# Patient Record
Sex: Female | Born: 1960 | Race: White | Hispanic: No | Marital: Married | Smoking: Never smoker
Health system: Southern US, Community
[De-identification: ages and names within clinical notes are randomized; demographics above are authoritative.]

## PROBLEM LIST (undated history)

## (undated) DIAGNOSIS — K219 Gastro-esophageal reflux disease without esophagitis: Secondary | ICD-10-CM

## (undated) DIAGNOSIS — R002 Palpitations: Secondary | ICD-10-CM

## (undated) DIAGNOSIS — J45909 Unspecified asthma, uncomplicated: Secondary | ICD-10-CM

## (undated) DIAGNOSIS — F411 Generalized anxiety disorder: Secondary | ICD-10-CM

## (undated) DIAGNOSIS — F329 Major depressive disorder, single episode, unspecified: Secondary | ICD-10-CM

## (undated) DIAGNOSIS — R0602 Shortness of breath: Secondary | ICD-10-CM

## (undated) DIAGNOSIS — I81 Portal vein thrombosis: Secondary | ICD-10-CM

## (undated) DIAGNOSIS — E119 Type 2 diabetes mellitus without complications: Secondary | ICD-10-CM

## (undated) DIAGNOSIS — F32A Depression, unspecified: Secondary | ICD-10-CM

## (undated) DIAGNOSIS — J189 Pneumonia, unspecified organism: Secondary | ICD-10-CM

## (undated) DIAGNOSIS — K529 Noninfective gastroenteritis and colitis, unspecified: Secondary | ICD-10-CM

## (undated) HISTORY — PX: BREAST EXCISIONAL BIOPSY: SUR124

## (undated) HISTORY — DX: Palpitations: R00.2

## (undated) HISTORY — DX: Major depressive disorder, single episode, unspecified: F32.9

## (undated) HISTORY — PX: ABDOMINAL HYSTERECTOMY: SHX81

## (undated) HISTORY — DX: Unspecified asthma, uncomplicated: J45.909

## (undated) HISTORY — PX: SHOULDER ARTHROSCOPY: SHX128

## (undated) HISTORY — DX: Shortness of breath: R06.02

## (undated) HISTORY — DX: Depression, unspecified: F32.A

## (undated) HISTORY — DX: Generalized anxiety disorder: F41.1

## (undated) HISTORY — DX: Noninfective gastroenteritis and colitis, unspecified: K52.9

## (undated) HISTORY — DX: Gastro-esophageal reflux disease without esophagitis: K21.9

## (undated) HISTORY — DX: Portal vein thrombosis: I81

---

## 1998-01-24 ENCOUNTER — Other Ambulatory Visit: Admission: RE | Admit: 1998-01-24 | Discharge: 1998-01-24 | Payer: Self-pay | Admitting: Obstetrics and Gynecology

## 1998-05-29 ENCOUNTER — Inpatient Hospital Stay (HOSPITAL_COMMUNITY): Admission: AD | Admit: 1998-05-29 | Discharge: 1998-06-01 | Payer: Self-pay | Admitting: Obstetrics and Gynecology

## 1999-04-06 ENCOUNTER — Ambulatory Visit (HOSPITAL_COMMUNITY): Admission: RE | Admit: 1999-04-06 | Discharge: 1999-04-06 | Payer: Self-pay | Admitting: Orthopedic Surgery

## 1999-04-06 ENCOUNTER — Encounter: Payer: Self-pay | Admitting: Orthopedic Surgery

## 1999-04-10 ENCOUNTER — Encounter: Payer: Self-pay | Admitting: Neurosurgery

## 1999-04-10 ENCOUNTER — Ambulatory Visit (HOSPITAL_COMMUNITY): Admission: RE | Admit: 1999-04-10 | Discharge: 1999-04-11 | Payer: Self-pay | Admitting: Neurosurgery

## 1999-05-30 ENCOUNTER — Encounter: Payer: Self-pay | Admitting: Neurosurgery

## 1999-05-30 ENCOUNTER — Ambulatory Visit (HOSPITAL_COMMUNITY): Admission: RE | Admit: 1999-05-30 | Discharge: 1999-05-30 | Payer: Self-pay | Admitting: Neurosurgery

## 1999-06-01 ENCOUNTER — Encounter: Payer: Self-pay | Admitting: Neurosurgery

## 1999-06-01 ENCOUNTER — Ambulatory Visit (HOSPITAL_COMMUNITY): Admission: RE | Admit: 1999-06-01 | Discharge: 1999-06-01 | Payer: Self-pay | Admitting: Neurosurgery

## 2001-02-20 ENCOUNTER — Encounter: Payer: Self-pay | Admitting: Family Medicine

## 2001-02-20 ENCOUNTER — Encounter: Admission: RE | Admit: 2001-02-20 | Discharge: 2001-02-20 | Payer: Self-pay | Admitting: Family Medicine

## 2001-06-30 ENCOUNTER — Other Ambulatory Visit: Admission: RE | Admit: 2001-06-30 | Discharge: 2001-06-30 | Payer: Self-pay | Admitting: Obstetrics and Gynecology

## 2002-02-16 ENCOUNTER — Encounter (INDEPENDENT_AMBULATORY_CARE_PROVIDER_SITE_OTHER): Payer: Self-pay

## 2002-02-17 ENCOUNTER — Inpatient Hospital Stay (HOSPITAL_COMMUNITY): Admission: RE | Admit: 2002-02-17 | Discharge: 2002-02-18 | Payer: Self-pay | Admitting: Obstetrics and Gynecology

## 2003-02-28 ENCOUNTER — Emergency Department (HOSPITAL_COMMUNITY): Admission: EM | Admit: 2003-02-28 | Discharge: 2003-02-28 | Payer: Self-pay | Admitting: Emergency Medicine

## 2003-11-17 ENCOUNTER — Other Ambulatory Visit: Admission: RE | Admit: 2003-11-17 | Discharge: 2003-11-17 | Payer: Self-pay | Admitting: Obstetrics and Gynecology

## 2003-11-22 ENCOUNTER — Ambulatory Visit (HOSPITAL_COMMUNITY): Admission: RE | Admit: 2003-11-22 | Discharge: 2003-11-22 | Payer: Self-pay | Admitting: Obstetrics and Gynecology

## 2007-02-20 DIAGNOSIS — I81 Portal vein thrombosis: Secondary | ICD-10-CM

## 2007-02-20 HISTORY — DX: Portal vein thrombosis: I81

## 2007-09-20 ENCOUNTER — Ambulatory Visit: Payer: Self-pay | Admitting: Internal Medicine

## 2007-09-20 ENCOUNTER — Inpatient Hospital Stay (HOSPITAL_COMMUNITY): Admission: EM | Admit: 2007-09-20 | Discharge: 2007-09-23 | Payer: Self-pay | Admitting: Emergency Medicine

## 2007-09-22 ENCOUNTER — Ambulatory Visit: Payer: Self-pay | Admitting: *Deleted

## 2007-09-22 ENCOUNTER — Encounter (INDEPENDENT_AMBULATORY_CARE_PROVIDER_SITE_OTHER): Payer: Self-pay | Admitting: Internal Medicine

## 2007-09-24 ENCOUNTER — Ambulatory Visit: Payer: Self-pay | Admitting: Gastroenterology

## 2007-10-30 ENCOUNTER — Encounter: Admission: RE | Admit: 2007-10-30 | Discharge: 2007-10-30 | Payer: Self-pay | Admitting: Gastroenterology

## 2008-10-11 ENCOUNTER — Encounter: Admission: RE | Admit: 2008-10-11 | Discharge: 2008-10-11 | Payer: Self-pay | Admitting: Gastroenterology

## 2008-11-10 ENCOUNTER — Ambulatory Visit (HOSPITAL_COMMUNITY): Admission: RE | Admit: 2008-11-10 | Discharge: 2008-11-10 | Payer: Self-pay | Admitting: Gastroenterology

## 2008-11-19 ENCOUNTER — Encounter: Admission: RE | Admit: 2008-11-19 | Discharge: 2008-11-19 | Payer: Self-pay | Admitting: Gastroenterology

## 2009-01-10 ENCOUNTER — Encounter: Admission: RE | Admit: 2009-01-10 | Discharge: 2009-01-10 | Payer: Self-pay | Admitting: Obstetrics and Gynecology

## 2010-04-24 ENCOUNTER — Other Ambulatory Visit: Payer: Self-pay | Admitting: Obstetrics and Gynecology

## 2010-04-24 DIAGNOSIS — Z1231 Encounter for screening mammogram for malignant neoplasm of breast: Secondary | ICD-10-CM

## 2010-04-26 ENCOUNTER — Ambulatory Visit
Admission: RE | Admit: 2010-04-26 | Discharge: 2010-04-26 | Disposition: A | Discharge status: Home / Self Care | Payer: 59 | Source: Ambulatory Visit | Attending: Obstetrics and Gynecology | Admitting: Obstetrics and Gynecology

## 2010-04-26 DIAGNOSIS — Z1231 Encounter for screening mammogram for malignant neoplasm of breast: Secondary | ICD-10-CM

## 2010-05-09 IMAGING — CT CT PELVIS W/ CM
2 of 5 series · 14 of 32 positions shown, 19 images · IV contrast (agent unspecified)
Comparison: None

CT ABDOMEN

CLINICAL DATA: Epigastric pain and rectal bleeding

CT ABDOMEN AND PELVIS WITH CONTRAST
TECHNIQUE: Multidetector CT imaging of the abdomen and pelvis was
performed using the standard protocol following bolus
administration of intravenous contrast.
Contrast:  100 ml of omni 300

[Series 2: routine abdomen · axial · 0.82mm/px · z∈[-458,-133]mm · 6 of 91 slices shown, 11 images]
[im 13/91  soft-tissue]
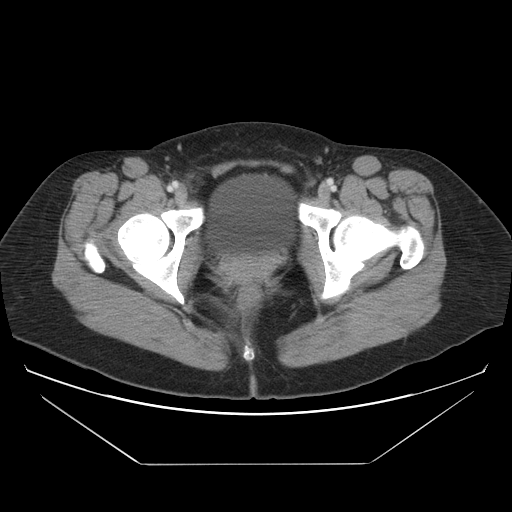
[im 13/91  bone]
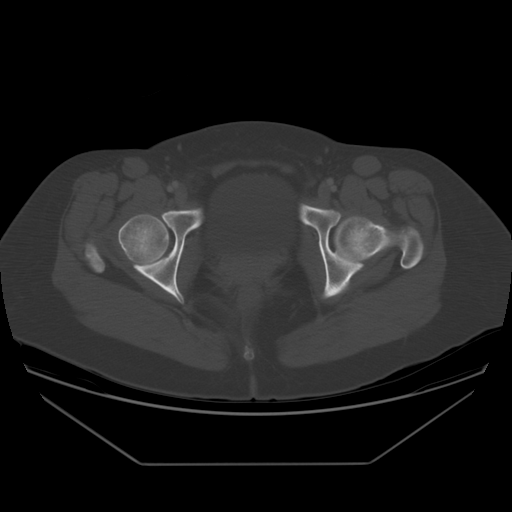
[im 26/91  soft-tissue]
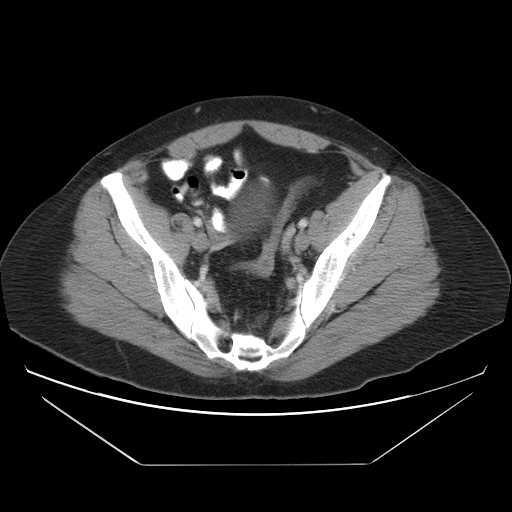
[im 39/91  soft-tissue]
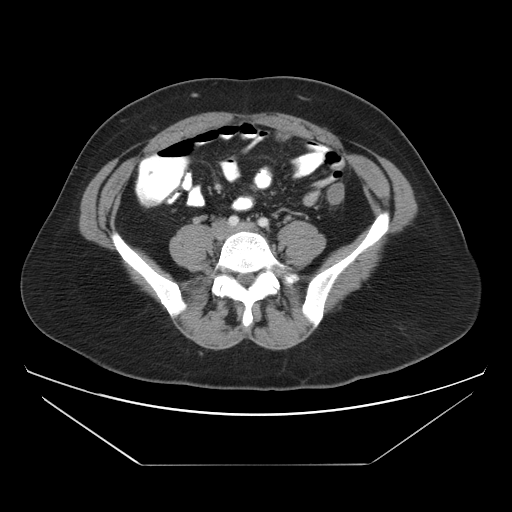
[im 39/91  lung]
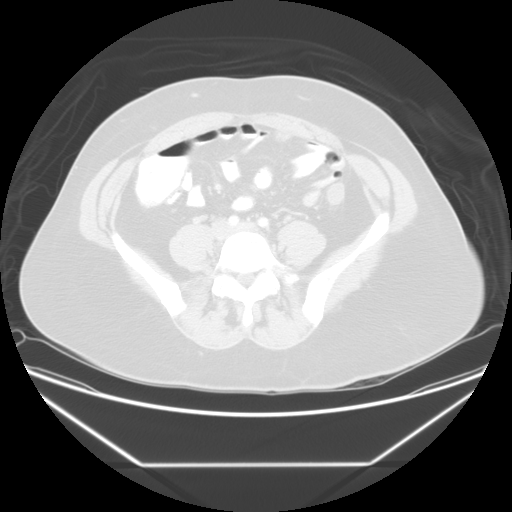
[im 52/91  soft-tissue]
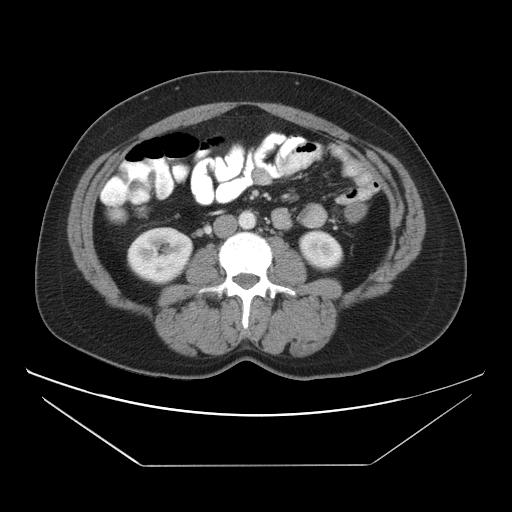
[im 52/91  lung]
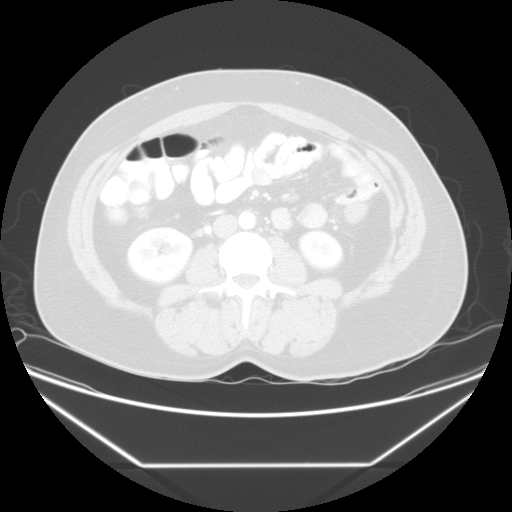
[im 65/91  soft-tissue]
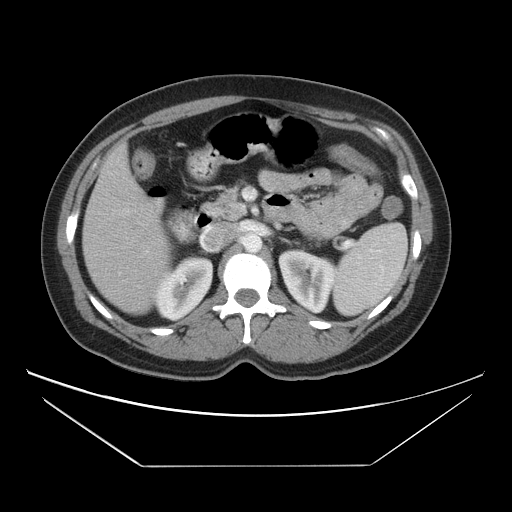
[im 65/91  lung]
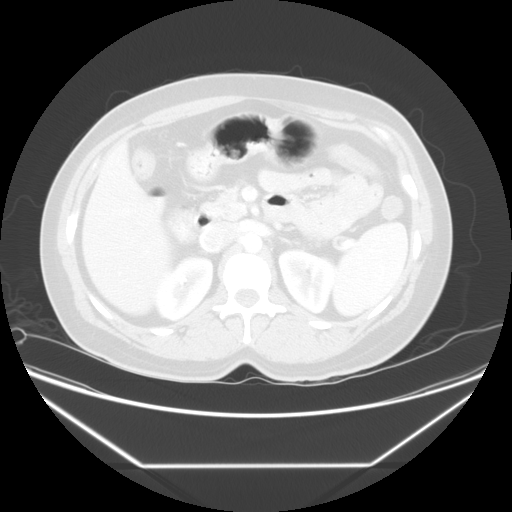
[im 78/91  soft-tissue]
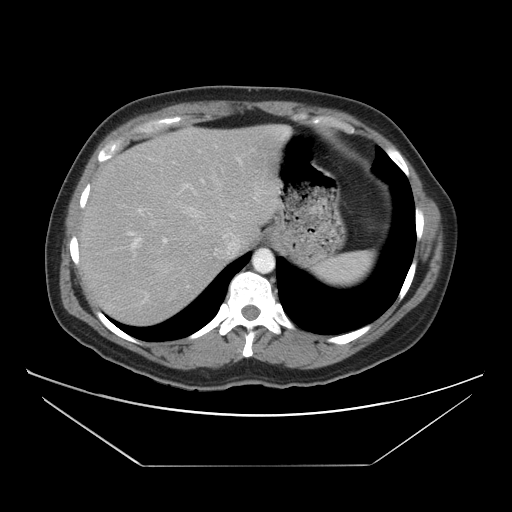
[im 78/91  lung]
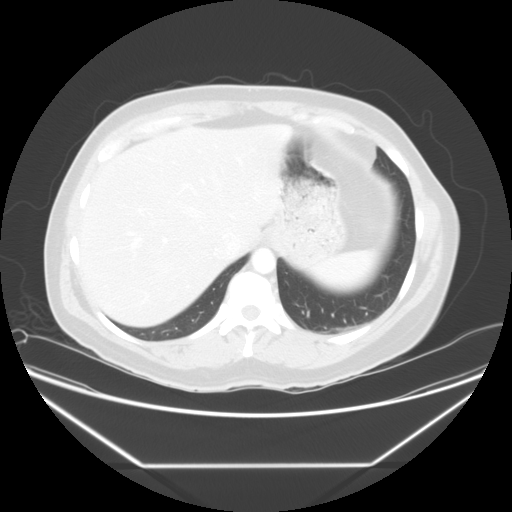

[Series 400: reformatted · sagittal · 0.90mm/px · 8 of 117 slices shown]
[im 12/117  soft-tissue]
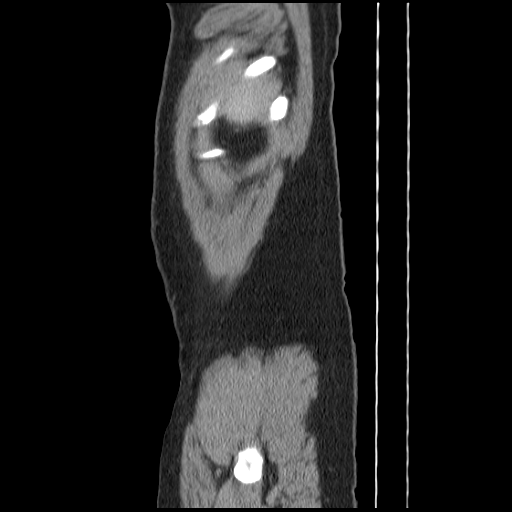
[im 24/117  soft-tissue]
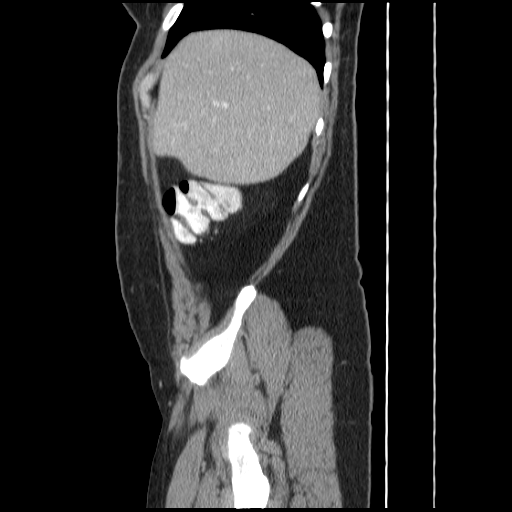
[im 35/117  soft-tissue]
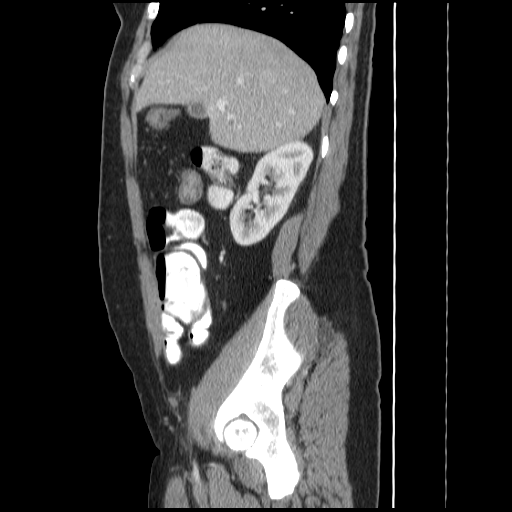
[im 47/117  soft-tissue]
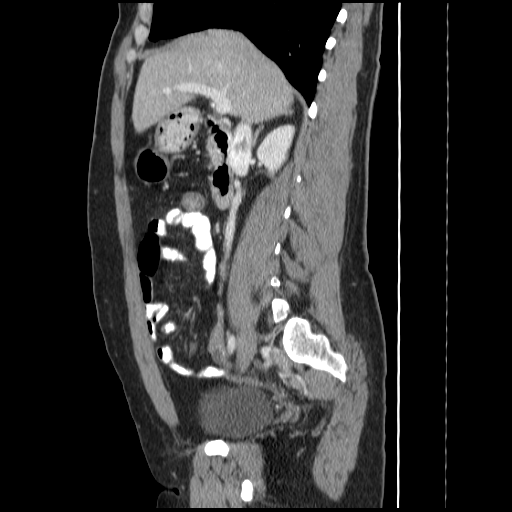
[im 70/117  soft-tissue]
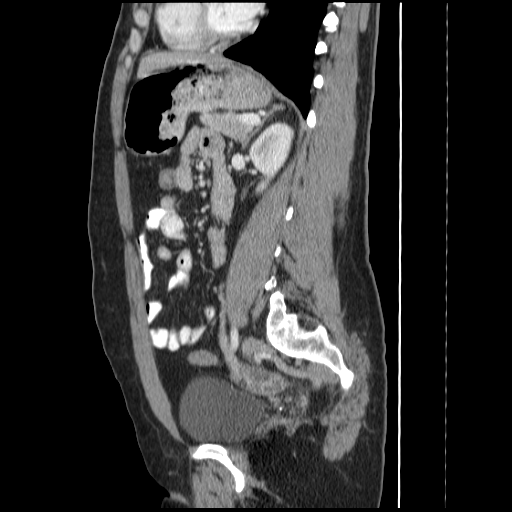
[im 82/117  soft-tissue]
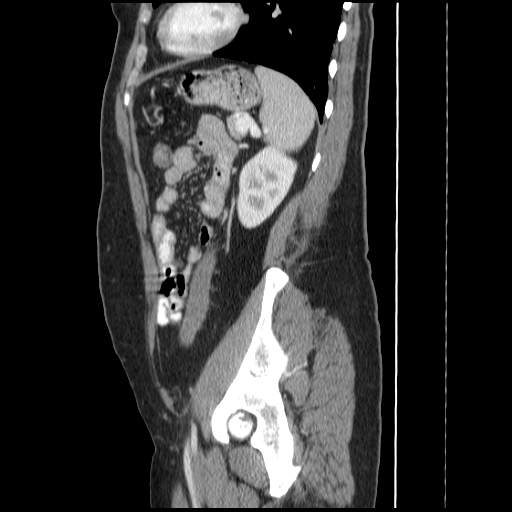
[im 93/117  soft-tissue]
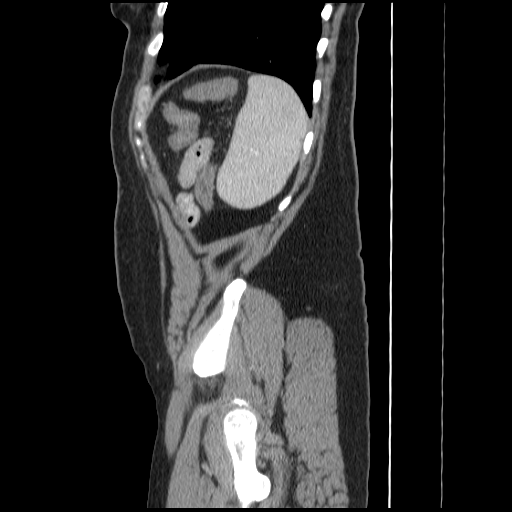
[im 105/117  soft-tissue]
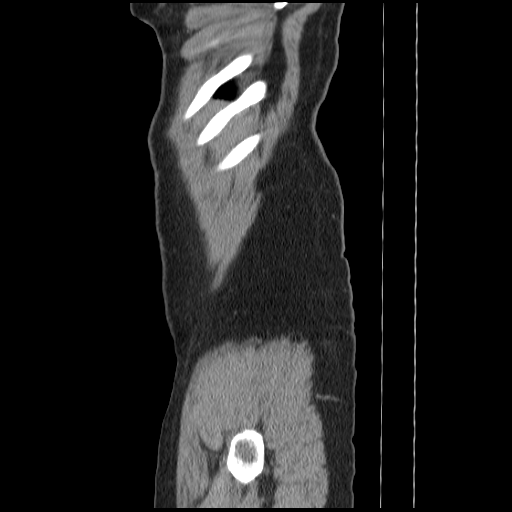

[14 of 32 positions shown; findings below may reference images not displayed]

FINDINGS: The lung bases are clear.

The liver is normal in attenuation and morphology.

The gallbladder negative

No biliary ductal dilatation

The pancreas is negative.

The spleen is negative.

There are abnormal low density filling defects within the middle
and right branches of the intrahepatic portal vein.

Negative for adenopathy.

The transverse colon and descending colon and rectosigmoid colon is
collapsed.  The wall appears edematous and there is a very mild
pericolonic fat stranding.

There is no evidence for bowel perforation.

No abscess is noted.

There is no obstruction.

Review of the visualized osseous structures is unremarkable.
IMPRESSION: 1.  There is acute thrombosis of the intrahepatic branches of the
portal vein.
2.  Suspected edema and mild pericolonic inflammatory changes
involving the transverse, descending colon and rectosigmoid colon.

CT PELVIS
FINDINGS: A small amount of free fluid within the pelvis is noted.

The urinary bladder is negative.

The rectosigmoid colon is collapsed.

The wall appears mildly edematous and there is mild pericolonic fat
stranding.

No abscess or bowel perforation is noted.

There is no mass or enlarged lymph nodes.

Review of the visualized osseous structures is unremarkable.
IMPRESSION: 1.  Suspect colitis involving the rectosigmoid colon.
2.  Small amount of free fluid within the pelvis.

## 2010-07-04 NOTE — H&P (Signed)
Helen Keller, GARGUILO                ACCOUNT NO.:  [id number]   MEDICAL RECORD NO.:  [id number]          PATIENT TYPE:  INP   LOCATION:  4734                         FACILITY:  [facility]   PHYSICIAN:  Joylene John, MD       DATE OF BIRTH:  29-Jun-1960   DATE OF ADMISSION:  09/19/2007  DATE OF DISCHARGE:                              HISTORY & PHYSICAL   CHIEF COMPLAINT:  The patient coming with acute onset of right upper  quadrant abdominal pain on September 19, 2007, latter half of the evening  followed by bright red blood per rectum and a bloody bowel movement.   HISTORY OF PRESENT ILLNESS:  This is a 50 year old white female with no  significant past medical history.  Per the patient, she was driving with  her husband when she started having severe upper abdominal pain, which  prompted her to rush to the bathroom in her house.  The patient barely  made it to the bathroom nearest to the door, felt extremely hot and  passed out.  She was revived by her husband and made it to the upstairs  bathroom where she had a bloody bowel movement.  The patient denies any  similar episodes in the past.  The patient denies taking any new  medications or oral contraceptives.  The patient tells me that her  abdominal pain is a little better in that she has had 2 more bloody  bowel movements in the ER.  No similar episode in the past.   PAST MEDICAL HISTORY:  Hysterectomy, a few years ago.   PAST SURGICAL HISTORY:  Hysterectomy, a few years ago.   MEDICATIONS:  Just an allergy medication.   ALLERGIES:  CODEINE.   FAMILY HISTORY:  Noncontributory.   SOCIAL HISTORY:  No alcohol or drugs.  Works and lives with the family.   REVIEW OF SYSTEMS:  Please refer to the HPI.   PHYSICAL EXAMINATION:  VITAL SIGNS:  Temperature of 98.2, blood pressure  of 119/76, pulse 65, respirations 16, and the patient is saturating 100%  on room air.  GENERAL:  This is a white female in no acute distress.  NECK:  Supple.  No  JVD appreciated.  No thrills noted.  LUNGS:  Clear to auscultation.  CARDIOVASCULAR:  Regular rate and rhythm.  ABDOMEN:  The patient does have mild tenderness in the right upper  quadrant and the epigastric region.  There is no rebound or guarding.  Bowel sounds are present.  LOWER EXTREMITIES:  No edema appreciated.  No rash noted.   LABORATORY DATA:  Pertinent labs show a white count of 14.5 with a high  neutrophil count, hemoglobin 12.4, hematocrit 36.8, and platelets  318,000.  Albumin 3.9, AST 23, and ALT 22.  Sodium 134, potassium 3.7,  chloride 101, bicarb 26, BUN 10, creatinine 0.85, and glucose 133.   IMAGING:  CT scan showing inflammatory changes and edema in transverse,  descending, and rectosigmoid colon.  There is acute thrombus in the  intrahepatic portion of the portal vein.   ASSESSMENT AND PLAN:  A 50 year old female coming  in with acute  abdominal pain and bright red blood per rectum.  CT scan consistent with  new portal vein thrombosis.   Plan is to admit the patient to a Telemetry bed and monitor her serial  CBCs, one stat now followed by one in the morning.  Two IV med blocks in  case she needs resuscitation with IV fluids.  Active type and cross and  3 units on hold for packed red cells in case she needs transfusion.  We  will do a hypercoagulable workup, which will include a protein C,  protein S, antithrombin III, factor V Leiden, and lupus anticoagulant.  We will do blood cultures x2, and since the white count is elevated, we  will cover with antibiotics, which will include Cipro 400 IV q.12 h. and  Flagyl 500 mg IV q.6 h.  The patient tells me that her Pap, mammo, and  colonoscopy are up-to-date.  Last scope was approximately 2 years ago  with some polyps.  She may need additional workup for malignancy  depending if there are no other causes found.  If the patient  deteriorates, the plan is to call a Surgery consult.  We will not  anticoagulate the patient  for the thrombus since she is actively  bleeding.      Joylene John, MD  Electronically Signed     RP/MEDQ  D:  09/20/2007  T:  09/21/2007  Job:  [id number]

## 2010-07-04 NOTE — Discharge Summary (Signed)
Helen Keller, Helen Keller                ACCOUNT NO.:  [id number]   MEDICAL RECORD NO.:  [id number]          PATIENT TYPE:  INP   LOCATION:  4734                         FACILITY:  [facility]   PHYSICIAN:  Corinna L. Lendell Caprice, MDDATE OF BIRTH:  [birth year]   DATE OF ADMISSION:  09/20/2007  DATE OF DISCHARGE:  09/23/2007                               DISCHARGE SUMMARY   DISCHARGE DIAGNOSES:  1. Intrahepatic portal vein thrombosis.  2. Hematochezia secondary to colitis, resolved, suspect ischemic      colitis.  3. Hyperhomocysteinemia.  4. Gastroesophageal reflux disease.  5. Acute blood loss anemia, mild.   DISCHARGE MEDICATIONS:  Lovenox 100 mg subcutaneously daily until INR is  greater than or equal to 2.  I have given a prescription for 5 doses.  Coumadin 10 mg p.o. tonight, then 5 mg daily.  Dr. Manus Gunning to adjust to  keep INR between 2 and 3.  Prilosec 20 mg a day, stop Depo-Provera.  She  may resume Claritin and follow up with Dr. Manus Gunning in 1-2 days, at which  time an INR should be drawn.  Follow up with Dr. Gaylyn Rong in 3 months, at  which time antiphospholipid antibodies should be redrawn and length of  Coumadin treatment to be assessed.   CONDITION:  Stable.   ACTIVITY:  Ad lib.   CONSULTATIONS:  Gastroenterology.   PROCEDURES:  None.   PERTINENT LABORATORY DATA:  CBC on admission significant for a white  blood cell count of 14,000 with 83% neutrophils and 11% lymphocytes.  Hemoglobin was 12.4 and hematocrit 36.8.  At discharge, her white count  is 7000, hemoglobin is 10.4, and hematocrit 30.6.  PT/PTT was normal.  Factor V Leiden mutation negative.  Antithrombin III normal.  Functional  protein C normal.  Functional protein S normal.  Lupus anticoagulant not  detected.  Homocystine was 34.  Urinalysis showed 15 ketones, trace  blood, negative protein, negative nitrite, negative leukocyte esterase.  Rheumatoid factor less than 20.  ANA negative.  Hemoccult positive.  Blood cultures  negative.  Urine pregnancy negative.  Prothrombin, gene  mutation is pending.  Anticardiolipin antibody is pending.   SPECIAL STUDIES/RADIOLOGY:  CT of the abdomen and pelvis showed acute  thrombosis of the middle and right branches of the intrahepatic portal  vein and suspected edema and mild pericolonic inflammatory changes in  the transverse descending and rectosigmoid colon suspect colitis.   HISTORY AND HOSPITAL COURSE:  Ms. Klump is a pleasant 50 year old white  female, patient of Dr. Manus Gunning who was seen Dr. Elnoria Howard in the past who  presented with sudden onset abdominal pain, syncope, and bright red  blood per rectum.  She has had episodes of abdominal pain and diarrhea  in the past and had workup by Dr. Elnoria Howard.  She reports that she was told  it might be irritable bowel syndrome.  She continued to have pain,  however, and blood per rectum and therefore came to the emergency room.  CAT scan results were as above.  She was started initially empirically  on Cipro and Flagyl.  She had no further hematochezia  or diarrhea.  She  had no fevers.  [facility] was on-call for Dr. Elnoria Howard and recommended  starting heparin.  She had no problems with bleeding on this and was  subsequently started on Coumadin.  Dr. hung felt that this may be  ischemic colitis, but did not recommend any endoscopy.  The patient also  reported some arthralgias prior to this episode, which had resolved.  Her abdominal pain has resolved, but she has complained of some  heartburn.  Her hemoglobin dropped a bit, but she did not require  transfusion.  A hypercoagulable workup was undertaken and the  anticardiolipin antibody and prothrombin gene mutation is pending, but  otherwise showed an elevated homocysteine but otherwise negative.  The  patient had been on Depo-Provera, so this certainly could have  contributed.  I have discussed the case with Dr. Gaylyn Rong who was on-call for  hematology and he recommends 6-9 months of Coumadin  if her  hypercoagulable workup is negative, but he recommends repeating the  antiphospholipid antibodies in 3 months.  He also can make  recommendations with regard to the hyperhomocysteinemia.  Based upon my  literature review, vitamin supplementation does not decrease chance of  deep vein thrombosis, but this is an option.  The patient should stop  Depo-Provera.  She is not a smoker and has no other risk factors for  blood clotting.  She is anxious to go home and was just started on  Coumadin yesterday, so we will need a close followup in the office  regarding Coumadin and Lovenox management.  She has been given self  Lovenox teaching and Coumadin teaching.  I will leave a message with Dr.  Manus Gunning with regard to Coumadin and follow up this week.  Total time on  the day of discharge is 60 minutes.      Corinna L. Lendell Caprice, MD  Electronically Signed     CLS/MEDQ  D:  09/23/2007  T:  09/23/2007  Job:  [id number]   cc:   Jordan Hawks. Elnoria Howard, MD  Bryan Lemma. Manus Gunning, M.D.  Jethro Bolus, MD  Stann Mainland. Vincente Poli, M.D.

## 2010-07-07 NOTE — Discharge Summary (Signed)
   NAME:  Helen Keller, Helen Keller                          ACCOUNT NO.:  [id number]   MEDICAL RECORD NO.:  [id number]                   PATIENT TYPE:  INP   LOCATION:  9123                                 FACILITY:  [facility]   PHYSICIAN:  Michelle L. Vincente Poli, M.D.            DATE OF BIRTH:  03-May-1960   DATE OF ADMISSION:  DATE OF DISCHARGE:  02/18/2002                                 DISCHARGE SUMMARY   ADMISSION DIAGNOSIS:  Menorrhagia, pelvic pain.   DISCHARGE DIAGNOSIS:  Menorrhagia, pelvic pain.   HOSPITAL COURSE:  A 50 year old patient who was admitted for an  LAVH.  She  did very well.  At the time of surgery estimated blood loss was 300 cc.  On  postoperative day #1 her hemoglobin was 9.1.  She was doing very well.  She  was advanced on her diet.  She was ambulating and voiding well.  She was  discharged to home in good condition on postoperative day #2.  She is  tolerating a regular diet, ambulating and voiding without difficulty.  She  remained afebrile with stable vital signs.  She was discharged to home to  follow up in the office in two weeks.  She was given a prescription for  ibuprofen 600 mg #60 and Tylox #30 to take as needed for pain.  She was  advised to call if she has any heavy vaginal bleeding, temperature greater  than 100.5, nausea, vomiting or severe abdominal pain.                                               Michelle L. Vincente Poli, M.D.    Florestine Avers  D:  03/24/2002  T:  03/24/2002  Job:  [id number]

## 2010-07-07 NOTE — Op Note (Signed)
[place]. [facility]  Patient:    Helen Keller, Helen Keller                       MRN: [id number] Proc. Date: 04/10/99 Adm. Date:  [id number] Disc. Date: [id number] Attending:  Donn Pierini                           Operative Report  SERVICE:  Neurosurgery.  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  OPERATION:  A C5-6 anterior cervical diskectomy and fusion with allograft.  SURGEON:  Julio Sicks, M.D.  ASSISTANT:  Bradd Canary., M.D.  ANESTHESIA:  General endotracheal.  INDICATIONS:  Ms. Kozma is a 50 year old female with a history of neck and right upper extremity pain.  She has weaknesses with a right-sided C6 radiculopathy which has failed conservative management.  MRI scan demonstrates a large broad-based C5-6 disk herniation with extension into the right-sided C6 foramen. 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  She has no evidence of intramedullary tumor or abnormal enhancement.  There is no evidence of Chiari malformation.  There is no other reason for this syringomyelia to be present other than the coexistent disk herniation with spinal cord compression.  The patient has been counseled as to er options, including both nonoperative and inoperative care.  The patient has decided to proceed with C5-6 anterior cervical diskectomy and fusion without allograft, and hope for relief of her symptoms.  DESCRIPTION OF PROCEDURE:  The patient was taken to the operating room and placed on the operating table in the supine position.  After an adequate level of anesthesia was achieved, the patient was positioned supine with her neck slightly extended and held in place with altered traction.  The patients anterior cervical region  was shaved and prepped sterilely.  A 10 blade was used to make linear incision extending from just right in midline to the medial border of sternocleidomastoid muscle on the right.  This was carried down sharply to the platysma.  The platysma was then divided vertically and dissection proceeds along the medial aspect of the right-sided sternocleidomastoid muscle and carotid sheath. Trachea and esophagus are mobilized and retracted towards the left.  Vertebral fascia is stripped off the anterior spinal column.  Longus colli muscle was elevated bilaterally using electrocautery.  Deep self-retaining retractor was placed.  X-ray was taken.  The C5-6 level was confirmed.  Disk space was entered with a size 15 blade in a rectangular fashion.  A wide disk space created in the sheath using pituitary rongeurs, forward and backward, using angled Carlens curets, Kerrison rongeurs and a high-speed drill.  All of the disk was removed down to the level of the posterior annulus.  Microscope was brought in he field and used for microdissection of the remaining aspects of the disk herniation. The remaining aspects of the annulus and osteophytes were removed down below the posterior longitudinal ligament.  The posterior longitudinal ligament was removed then elevators dissected in a piecemeal fashion using Kerrison rongeurs.  The underlying thecal sac and C6 nerve root was identified on the left side. The C6 nerve root was followed out distally to the foramen and widely decompressed. Decompression was then proceeded down to the right side of C6  foramen.  Disk herniation was encountered along the way which was completely resected.  The proximal C6 nerve root was identified and then followed out distally to its foramen.  This was widely decompressed.  There was no evidence of any continued  compression.  The wound was then copiously irrigated with ______ solution. Gelfoam  was placed topically for  hemostasis which was found to be good.  The disk space was then distracted and a 7 mm fibular wedge allograft was paced  into place, recessed appoximately 2 mm in the anterior cortical surface.   The isk space spreader and retractors were removed.  Intraoperative x-ray revealed good  position of the bone graft to proper operative level.  The microscope was removed. Hemostasis was ensured with a bipolar electrocautery.  The wound was then irrigated one final time.  The wound was then closed with 3-0 Vicryl sutures to the platysma and 5-0 PDS and inverted subcuticular fashion.  Steri-Strips and a sterile dressing were applied.  There are no complications.  The patient tolerated the procedure  well and she returned to the recovery room for postop period. DD:  04/10/99 TD:  04/11/99 Job: [id number]

## 2010-07-07 NOTE — Op Note (Signed)
NAME:  Helen Keller, Helen Keller                          ACCOUNT NO.:  [id number]   MEDICAL RECORD NO.:  [id number]                   PATIENT TYPE:  OBV   LOCATION:  9123                                 FACILITY:  [facility]   PHYSICIAN:  Michelle L. Vincente Poli, M.D.            DATE OF BIRTH:  1960/12/16   DATE OF PROCEDURE:  02/16/2002  DATE OF DISCHARGE:                                 OPERATIVE REPORT   PREOPERATIVE DIAGNOSES:  1. Menorrhagia.  2. Pelvic pain.   POSTOPERATIVE DIAGNOSES:  1. Menorrhagia.  2. Pelvic pain.   PROCEDURE:  Laparoscopic assisted vaginal hysterectomy.   SURGEON:  Michelle L. Vincente Poli, M.D.   ASSISTANT:  Dineen Kid. Rana Snare, M.D.   ANESTHESIA:  General.   ESTIMATED BLOOD LOSS:  300 cc.   FINDINGS:  Enlarged uterus grossly consistent with adenomyosis.   PROCEDURE:  The patient was taken to the operating room after informed  consent was obtained.  She was intubated without difficulty.  She was then  placed in lithotomy position.  The abdomen, vagina, and vulva were prepped  and draped in the usual sterile fashion.  A Foley catheter was placed into  the bladder after a sterile drape was applied.  Attention then was turned to  the abdomen where a small infraumbilical incision was made with a scalpel  and the Veress needle was inserted without difficulty.  Pneumoperitoneum was  then achieved with CO2.  The Veress needle was removed and a 12 mm trocar  was inserted into the abdominal cavity and the laparoscope was immediately  introduced into the abdominal cavity.  There was no bleeding or intestinal  injury noted.  The patient was then placed in Trendelenburg position and a  secondary port was placed suprapubically under direct visualization using a  5 mm trocar.  This was placed in the midline.  Using a blunt probe the  abdomen and pelvis were inspected.  The lower abdomen appeared normal.  The  uterus was enlarged grossly consistent with adenomyosis.  There was no  evidence of endometriosis.  Her ovaries appeared within normal limits.  She  is status post surgical tubal ligation.  Then using the gyrus instrument we  then placed this initially on the left triple pedicle and electrocoagulated  this and then divided the triple pedicle.  This was performed in a similar  fashion on the right side with good hemostasis.  Attention was then at this  point turned vaginally to complete the remainder of the surgery.  The  pneumoperitoneum was released and all instruments except for the trocars  were removed from the abdominal cavity.  We then placed the patient's legs  in high lithotomy position and the speculum was inserted into the vagina.  The cervix was grasped with the tenaculum and using electrocoagulation a  circumferential incision was made around the cervix.  The overlying vaginal  epithelium was pushed away anteriorly, posteriorly, and laterally.  The  posterior cul-de-sac was entered sharply using Mayo scissors and a weighted  speculum was inserted into the cul-de-sac.  Using a curved Heaney clamp the  uterosacral cardinal ligaments were then clamped sequentially on either  side.  Each pedicle was cut and suture ligated using 0 Vicryl suture.  We  then turn our attention anteriorly where the peritoneum was easily  visualized and the anterior cul-de-sac was entered sharply and the remainder  of the hysterectomy was completed from below with sequential clamps using  curved Heaney clamps clamping the remainder of the broad ligament just  beside the cervix.  Each pedicle was cut and suture ligated using 0 Vicryl  suture.  Once we reached the level of the triple pedicle which had already  been transected, the uterus was then removed using a Lahey clamp.  There was  no bleeding from any pedicle noted and the vaginal cuff was closed with a  continuous running locked stitch using 2-0 Vicryl suture.  Attention was  then turned to the abdomen where the  pneumoperitoneum was then performed  again and using a laparoscope all pedicles were directly visualized.  Irrigation was performed and hemostasis was noted.  The pneumoperitoneum was  then released.  The incisions were closed using 3-0 Vicryl interrupteds at  both incision sites and then a Band-aid was then placed over each incision.  All sponge, lap, and instrument counts were correct x2.  The patient went to  recovery room in stable condition.   PATHOLOGY:  Uterus with cervix.   COMPLICATIONS:  None.   DRAINS:  Foley.                                               Michelle L. Vincente Poli, M.D.    Florestine Avers  D:  02/17/2002  T:  02/17/2002  Job:  [id number]

## 2010-11-17 LAB — CULTURE, BLOOD (ROUTINE X 2)
Culture: NO GROWTH
Culture: NO GROWTH

## 2010-11-17 LAB — PROTIME-INR
INR: 1
Prothrombin Time: 13.7

## 2010-11-17 LAB — COMPREHENSIVE METABOLIC PANEL
ALT: 21
AST: 23
AST: 24
Albumin: 3.9
Albumin: 3.5
BUN: 10
CO2: 25
Calcium: 9.2
Chloride: 105
Creatinine, Ser: 0.85
Creatinine, Ser: 0.82
GFR calc Af Amer: >60
GFR calc non Af Amer: >60
GFR calc non Af Amer: >60
Glucose, Bld: 133 — ABNORMAL HIGH
Potassium: 3.8
Sodium: 134 — ABNORMAL LOW
Sodium: 135
Total Bilirubin: 0.8
Total Bilirubin: 1
Total Protein: 6.8

## 2010-11-17 LAB — DIFFERENTIAL
Basophils Absolute: 0.1
Eosinophils Absolute: 0.1
Eosinophils Relative: 1
Lymphocytes Relative: 11 — ABNORMAL LOW
Monocytes Absolute: 0.6
Neutrophils Relative %: 83 — ABNORMAL HIGH

## 2010-11-17 LAB — URINE MICROSCOPIC-ADD ON

## 2010-11-17 LAB — URINALYSIS, ROUTINE W REFLEX MICROSCOPIC
Glucose, UA: NEGATIVE
Ketones, ur: 15 — AB
Specific Gravity, Urine: 1.031 — ABNORMAL HIGH

## 2010-11-17 LAB — CBC
HCT: 32.5 — ABNORMAL LOW
HCT: 36.3
Hemoglobin: 12.4
Hemoglobin: 12.2
MCHC: 33.8
MCHC: 33.9
MCV: 90.1
MCV: 90.3
Platelets: 266
Platelets: 294
RBC: 4.02
RBC: 3.77 — ABNORMAL LOW
RBC: 3.97
RDW: 12.2
RDW: 12.8
RDW: 12.8
WBC: 14.5 — ABNORMAL HIGH
WBC: 10
WBC: 12.3 — ABNORMAL HIGH

## 2010-11-17 LAB — LIPASE, BLOOD: Lipase: 16

## 2010-11-17 LAB — HOMOCYSTEINE: Homocysteine: 34 — ABNORMAL HIGH

## 2010-11-17 LAB — ANA: Anti Nuclear Antibody(ANA): NEGATIVE

## 2010-11-17 LAB — LUPUS ANTICOAGULANT PANEL: Lupus Anticoagulant: NOT DETECTED

## 2010-11-17 LAB — PROTEIN S, TOTAL: Protein S Ag, Total: 86 % (ref 70–140)

## 2010-11-17 LAB — RHEUMATOID FACTOR: Rhuematoid fact SerPl-aCnc: <20

## 2010-11-17 LAB — TYPE AND SCREEN

## 2010-11-17 LAB — HEPARIN LEVEL (UNFRACTIONATED)
Heparin Unfractionated: 0.52
Heparin Unfractionated: <0.1 — ABNORMAL LOW

## 2010-11-17 LAB — CARDIOLIPIN ANTIBODIES, IGG, IGM, IGA
Anticardiolipin IgG: 9 — ABNORMAL LOW (ref <11)
Anticardiolipin IgM: <7 — ABNORMAL LOW (ref <10)

## 2010-11-17 LAB — ANTITHROMBIN III: AntiThromb III Func: 110 (ref 76–126)

## 2011-06-18 ENCOUNTER — Other Ambulatory Visit: Payer: Self-pay | Admitting: Surgery

## 2011-07-30 ENCOUNTER — Other Ambulatory Visit: Payer: Self-pay | Admitting: Dermatology

## 2011-12-12 ENCOUNTER — Other Ambulatory Visit: Payer: Self-pay | Admitting: Obstetrics and Gynecology

## 2011-12-28 ENCOUNTER — Other Ambulatory Visit: Payer: Self-pay | Admitting: Surgery

## 2012-12-26 ENCOUNTER — Other Ambulatory Visit (HOSPITAL_COMMUNITY): Payer: 59

## 2013-01-28 ENCOUNTER — Encounter: Payer: Self-pay | Admitting: *Deleted

## 2013-01-29 ENCOUNTER — Ambulatory Visit (INDEPENDENT_AMBULATORY_CARE_PROVIDER_SITE_OTHER): Payer: PRIVATE HEALTH INSURANCE | Admitting: Physician Assistant

## 2013-01-29 ENCOUNTER — Other Ambulatory Visit: Payer: Self-pay | Admitting: *Deleted

## 2013-01-29 DIAGNOSIS — R0602 Shortness of breath: Secondary | ICD-10-CM

## 2013-01-29 NOTE — Progress Notes (Signed)
Order placed for GXT under DOD, Dr. [place].

## 2013-01-29 NOTE — Progress Notes (Signed)
Exercise Treadmill Test  Helen Keller is a 52 y.o. female non-smoker with no hx of DM, HTN, HL.  She has a FHx CAD in her father.  She has had recent palpitations and dyspnea. No chest pain. No syncope.  Exam unremarkable.  ECG:  No ST changes.  Pre-Exercise Testing Evaluation Rhythm: normal sinus  Rate: 67 bpm     Test  Exercise Tolerance Test Ordering MD: Peter Swaz[place], MD  Interpreting MD: Tereso Newcomer, PA-C  Unique Test No: 1  Treadmill:  1  Indication for ETT: exertional dyspnea  Contraindication to ETT: No   Stress Modality: exercise - treadmill  Cardiac Imaging Performed: non   Protocol: standard Bruce - maximal  Max BP:  197/84  Max MPHR (bpm):  170 85% MPR (bpm):  145  MPHR obtained (bpm):  171 % MPHR obtained:  100  Reached 85% MPHR (min:sec):  3:54 Total Exercise Time (min-sec):  7:00  Workload in METS:  8.5 Borg Scale: 17  Reason ETT Terminated:  patient's desire to stop    ST Segment Analysis At Rest: normal ST segments - no evidence of significant ST depression With Exercise: non-specific ST changes, there was increased artifact  Other Information Arrhythmia:  No Angina during ETT:  absent (0) Quality of ETT:  diagnostic  ETT Interpretation:  normal - no evidence of ischemia by ST analysis  Comments: Good exercise capacity. No chest pain. Normal BP response to exercise. No significant ST changes to suggest ischemia.  No atrial or ventricular arrhythmias noted.  Recommendations: F/u with PCP as directed. Signed,  Tereso Newcomer, PA-C   01/29/2013 11:26 AM

## 2013-02-01 ENCOUNTER — Emergency Department (HOSPITAL_COMMUNITY)
Admission: EM | Admit: 2013-02-01 | Discharge: 2013-02-01 | Disposition: A | Discharge status: Home / Self Care | Payer: PRIVATE HEALTH INSURANCE | Attending: Emergency Medicine | Admitting: Emergency Medicine

## 2013-02-01 ENCOUNTER — Encounter (HOSPITAL_COMMUNITY): Payer: Self-pay | Admitting: Emergency Medicine

## 2013-02-01 DIAGNOSIS — R109 Unspecified abdominal pain: Secondary | ICD-10-CM

## 2013-02-01 DIAGNOSIS — R1013 Epigastric pain: Secondary | ICD-10-CM | POA: Insufficient documentation

## 2013-02-01 DIAGNOSIS — F411 Generalized anxiety disorder: Secondary | ICD-10-CM | POA: Insufficient documentation

## 2013-02-01 DIAGNOSIS — R112 Nausea with vomiting, unspecified: Secondary | ICD-10-CM | POA: Insufficient documentation

## 2013-02-01 DIAGNOSIS — K921 Melena: Secondary | ICD-10-CM | POA: Insufficient documentation

## 2013-02-01 DIAGNOSIS — Z79899 Other long term (current) drug therapy: Secondary | ICD-10-CM | POA: Insufficient documentation

## 2013-02-01 DIAGNOSIS — F3289 Other specified depressive episodes: Secondary | ICD-10-CM | POA: Insufficient documentation

## 2013-02-01 DIAGNOSIS — R1011 Right upper quadrant pain: Secondary | ICD-10-CM | POA: Insufficient documentation

## 2013-02-01 DIAGNOSIS — Z8679 Personal history of other diseases of the circulatory system: Secondary | ICD-10-CM | POA: Insufficient documentation

## 2013-02-01 DIAGNOSIS — F329 Major depressive disorder, single episode, unspecified: Secondary | ICD-10-CM | POA: Insufficient documentation

## 2013-02-01 LAB — CBC WITH DIFFERENTIAL/PLATELET
Eosinophils Absolute: 0.1 10*3/uL (ref 0.0–0.7)
Eosinophils Relative: 1 % (ref 0–5)
Hemoglobin: 13 g/dL (ref 12.0–15.0)
Lymphs Abs: 2.3 10*3/uL (ref 0.7–4.0)
MCH: 29.9 pg (ref 26.0–34.0)
MCV: 89.2 fL (ref 78.0–100.0)
Monocytes Absolute: 1 10*3/uL (ref 0.1–1.0)
Monocytes Relative: 8 % (ref 3–12)
Neutro Abs: 8.4 10*3/uL — ABNORMAL HIGH (ref 1.7–7.7)
RBC: 4.35 MIL/uL (ref 3.87–5.11)

## 2013-02-01 LAB — COMPREHENSIVE METABOLIC PANEL
Alkaline Phosphatase: 91 U/L (ref 39–117)
BUN: 8 mg/dL (ref 6–23)
Calcium: 9.1 mg/dL (ref 8.4–10.5)
Creatinine, Ser: 0.83 mg/dL (ref 0.50–1.10)
GFR calc Af Amer: >90 mL/min (ref >90)
Glucose, Bld: 106 mg/dL — ABNORMAL HIGH (ref 70–99)
Total Protein: 7.1 g/dL (ref 6.0–8.3)

## 2013-02-01 LAB — CG4 I-STAT (LACTIC ACID): Lactic Acid, Venous: 1.65 mmol/L (ref 0.5–2.2)

## 2013-02-01 LAB — LIPASE, BLOOD: Lipase: 21 U/L (ref 11–59)

## 2013-02-01 MED ORDER — SODIUM CHLORIDE 0.9 % IV BOLUS (SEPSIS)
1000.0000 mL | Freq: Once | INTRAVENOUS | Status: AC
  Administered 2013-02-01: 1000 mL via INTRAVENOUS

## 2013-02-01 MED ORDER — POTASSIUM CHLORIDE CRYS ER 20 MEQ PO TBCR
40.0000 meq | EXTENDED_RELEASE_TABLET | Freq: Once | ORAL | Status: AC
  Administered 2013-02-01: 40 meq via ORAL
  Filled 2013-02-01: qty 2

## 2013-02-01 MED ORDER — PANTOPRAZOLE SODIUM 40 MG IV SOLR
40.0000 mg | Freq: Once | INTRAVENOUS | Status: AC
  Administered 2013-02-01: 40 mg via INTRAVENOUS
  Filled 2013-02-01: qty 40

## 2013-02-01 MED ORDER — PANTOPRAZOLE SODIUM 40 MG PO TBEC: 40.0000 mg | DELAYED_RELEASE_TABLET | Freq: Every day | ORAL | Status: DC

## 2013-02-01 NOTE — ED Provider Notes (Signed)
CSN: [id number]     Arrival date & time 02/01/13  1247 History   None    Chief Complaint  Patient presents with  . Abdominal Pain   (Consider location/radiation/quality/duration/timing/severity/associated sxs/prior Treatment) HPI Ms. Helen Keller is a 52 y.o. female w/ PMHx of depression, colitis, GERD, SOB, presents to the ED w/ complaints of abdominal pain, nausea, vomiting, and bloody stools since yesterday. The patient claims the pain started after she ate a meal, described as epigastric in location, shape in nature and 10/10 in severity. The patient took some pepto-bismol with some relief. She claims she has had similar pain in the past, associated with eating fatty foods.The patient also describes an episode of vomiting, which contained only food particles, no hematemesis. The blood in her stool w/ bright red, in the toilet bowl, somewhat mixed with the stool, no melena. The patient denies any recent fever, chills, dizziness, lightheadedness, dysuria, or hematuria. 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 Elnoria Howard for her GI issues.   Past Medical History  Diagnosis Date  . Depression   . Portal vein thrombosis 2009    Intrahepatic  . Colitis   . GERD (gastroesophageal reflux disease)   . Palpitations   . Shortness of breath   . Anxiety state, unspecified    No past surgical history on file. Family History  Problem Relation Age of Onset  . Diabetes Mellitus I Father     deceased  . Coronary artery disease Father     deceased   History  Substance Use Topics  . Smoking status: Never Smoker   . Smokeless tobacco: Not on file  . Alcohol Use: No   OB History   Grav Para Term Preterm Abortions TAB SAB Ect Mult Living                 Review of Systems General: Denies fever, chills, diaphoresis, appetite change and fatigue.  Respiratory: Denies SOB, DOE, cough, chest tightness, and wheezing.   Cardiovascular:  Denies chest pain, palpitations and leg swelling.  Gastrointestinal: Positive for abdominal pain, blood in stool, nausea and vomiting.  Genitourinary: Denies dysuria, urgency, frequency, hematuria, flank pain and difficulty urinating.  Endocrine: Denies hot or cold intolerance, sweats, polyuria, polydipsia. Musculoskeletal: Denies myalgias, back pain, joint swelling, arthralgias and gait problem.  Skin: Denies pallor, rash and wounds.  Neurological: Denies dizziness, seizures, syncope, weakness, lightheadedness, numbness and headaches.  Psychiatric/Behavioral: Denies mood changes, confusion, nervousness, sleep disturbance and agitation.  Allergies  Codeine  Home Medications   Current Outpatient Rx  Name  Route  Sig  Dispense  Refill  . PARoxetine (PAXIL) 10 MG tablet   Oral   Take 10 mg by mouth daily.          Physical Exam Filed Vitals:   02/01/13 1351 02/01/13 1353 02/01/13 1355 02/01/13 1415  BP: 114/57 117/72 120/66 107/64  Pulse: 80 68 124 72  Temp:      TempSrc:      Resp:      Weight:      SpO2:    97%  General: Vital signs reviewed.  Patient is a well-developed and well-nourished, in no acute distress and cooperative with exam. Alert and oriented x3.  Head: Normocephalic and atraumatic. Eyes: PERRL, EOMI, conjunctivae normal, No scleral icterus.  Neck: Supple, trachea midline, normal ROM, No JVD, masses, thyromegaly, or carotid bruit present.  Cardiovascular: RRR, S1 normal, S2 normal, no murmurs, gallops, or  rubs. Pulmonary/Chest: Normal respiratory effort, CTAB, no wheezes, rales, or rhonchi. Abdominal: Soft, mildly tender in the RUQ, murphy's -ve, non-distended, bowel sounds are normal, no masses, organomegaly, no rebound or guarding present.  Musculoskeletal: No joint deformities, erythema, or stiffness, ROM full and no nontender. Extremities: No swelling or edema,  pulses symmetric and intact bilaterally. No cyanosis or clubbing. Neurological: A&O x3, Strength  is normal and symmetric bilaterally, cranial nerve II-XII are grossly intact, no focal motor deficit, sensory intact to light touch bilaterally.  Skin: Warm, dry and intact. No rashes or erythema. Psychiatric: Normal mood and affect. speech and behavior is normal. Cognition and memory are normal.   ED Course  Procedures (including critical care time) Labs Review Labs Reviewed  CBC WITH DIFFERENTIAL - Abnormal; Notable for the following:    WBC 11.8 (*)    Neutro Abs 8.4 (*)    All other components within normal limits  COMPREHENSIVE METABOLIC PANEL - Abnormal; Notable for the following:    Potassium 3.3 (*)    Glucose, Bld 106 (*)    ALT 36 (*)    GFR calc non Af Amer 81 (*)    All other components within normal limits  OCCULT BLOOD, POC DEVICE - Abnormal; Notable for the following:    Fecal Occult Bld POSITIVE (*)    All other components within normal limits  LIPASE, BLOOD  OCCULT BLOOD X 1 CARD TO LAB, STOOL  CG4 I-STAT (LACTIC ACID)   Imaging Review No results found.  EKG Interpretation   None       MDM   Ms. Helen Keller is a 52 y.o. female w/ PMHx of depression, colitis, GERD, SOB, presents to the ED w/ complaints of abdominal pain, nausea, vomiting, and bloody stools since yesterday. Patient has a history of portal vein thrombosis and possible related ischemic colitis as well. Given clinical presentation, some concern for PUD and ischemic colitis. Must also consider cholecystitis and pancreatitis, however, these are less likely at this time. -Orthostatic vital signs show tachycardia on standing; 91-124 pulse change. BP without significant orthostatic changes. Give 1L NS bolus. -FOBT +ve -CBC w/ mild leukocytosis of 11.8. Hb 13.0.  -CMET, Lipase wnl. K 3.3, will give 40 mEq K po  -Lactic acid 1.65, wnl  -Likely PUD at this time, given clinical presentation. Will give Protonix 40 mg IV for now and continue on PPI on discharge. Discussed w/ Dr. Loreta Ave, will have patient  see Dr. Elnoria Howard at 8:30 AM tomorrow morning for possible EGD.  Courtney Paris, MD 02/01/13 [phone]

## 2013-02-01 NOTE — ED Notes (Addendum)
Had some abd pain vomited a lot last night and then she had then she had bloody stools since midnight had sharp pain before she had the bloody stools has hx  Of same gets dizzy and thenw3eak

## 2013-02-01 NOTE — ED Notes (Signed)
The patient was complaining of shortness of breathing (standing up) during orthostatic vitals and felt tired. The tech has reported to the RN in charge.

## 2013-02-01 NOTE — ED Notes (Signed)
MD at bedside. 

## 2013-02-02 NOTE — ED Provider Notes (Signed)
I saw and evaluated the patient, reviewed the resident's note and I agree with the findings and plan.  EKG Interpretation   None       Patient presents with abdominal pain, n/v, and dark blood in stool. Nontoxic appearing.  Non-peritonitic on exam.  Grossly neg but hemoccult + from below. Lab work-up unremarkable Patient has an extensive history of GERD and is not on PPI.  GIven IV protonix.  Suspect PUD.  Patient is hemodynamically stable with a stable HCT.  Fluids given.  Patient arranged to see GI doctor tomorrow morning for possible EGD.  Given strict precautions.  After history, exam, and medical workup I feel the patient has been appropriately medically screened and is safe for discharge home. Pertinent diagnoses were discussed with the patient. Patient was given return precautions.   Shon Baton, MD 02/02/13 [phone]

## 2013-06-17 ENCOUNTER — Other Ambulatory Visit: Payer: Self-pay | Admitting: Gastroenterology

## 2013-06-17 ENCOUNTER — Ambulatory Visit
Admission: RE | Admit: 2013-06-17 | Discharge: 2013-06-17 | Disposition: A | Discharge status: Home / Self Care | Payer: 59 | Source: Ambulatory Visit | Attending: Gastroenterology | Admitting: Gastroenterology

## 2013-06-17 DIAGNOSIS — R109 Unspecified abdominal pain: Secondary | ICD-10-CM

## 2013-06-17 MED ORDER — IOHEXOL 300 MG/ML  SOLN
100.0000 mL | Freq: Once | INTRAMUSCULAR | Status: AC | PRN
  Administered 2013-06-17: 100 mL via INTRAVENOUS

## 2014-04-27 ENCOUNTER — Other Ambulatory Visit: Payer: Self-pay | Admitting: Obstetrics and Gynecology

## 2014-04-28 LAB — CYTOLOGY - PAP

## 2016-10-11 ENCOUNTER — Other Ambulatory Visit: Payer: Self-pay | Admitting: Obstetrics and Gynecology

## 2016-10-11 DIAGNOSIS — R928 Other abnormal and inconclusive findings on diagnostic imaging of breast: Secondary | ICD-10-CM

## 2016-10-15 ENCOUNTER — Ambulatory Visit
Admission: RE | Admit: 2016-10-15 | Discharge: 2016-10-15 | Disposition: A | Discharge status: Home / Self Care | Payer: 59 | Source: Ambulatory Visit | Attending: Obstetrics and Gynecology | Admitting: Obstetrics and Gynecology

## 2016-10-15 ENCOUNTER — Other Ambulatory Visit: Payer: Self-pay | Admitting: Obstetrics and Gynecology

## 2016-10-15 DIAGNOSIS — R928 Other abnormal and inconclusive findings on diagnostic imaging of breast: Secondary | ICD-10-CM

## 2016-10-15 DIAGNOSIS — N6310 Unspecified lump in the right breast, unspecified quadrant: Secondary | ICD-10-CM

## 2017-04-17 ENCOUNTER — Inpatient Hospital Stay: Admission: RE | Admit: 2017-04-17 | Payer: 59 | Source: Ambulatory Visit

## 2017-04-29 ENCOUNTER — Other Ambulatory Visit: Payer: Self-pay | Admitting: Obstetrics and Gynecology

## 2017-04-29 ENCOUNTER — Ambulatory Visit
Admission: RE | Admit: 2017-04-29 | Discharge: 2017-04-29 | Disposition: A | Discharge status: Home / Self Care | Payer: PRIVATE HEALTH INSURANCE | Source: Ambulatory Visit | Attending: Obstetrics and Gynecology | Admitting: Obstetrics and Gynecology

## 2017-04-29 DIAGNOSIS — N6310 Unspecified lump in the right breast, unspecified quadrant: Secondary | ICD-10-CM

## 2017-05-02 ENCOUNTER — Ambulatory Visit
Admission: RE | Admit: 2017-05-02 | Discharge: 2017-05-02 | Disposition: A | Discharge status: Home / Self Care | Payer: PRIVATE HEALTH INSURANCE | Source: Ambulatory Visit | Attending: Obstetrics and Gynecology | Admitting: Obstetrics and Gynecology

## 2017-05-02 DIAGNOSIS — N6310 Unspecified lump in the right breast, unspecified quadrant: Secondary | ICD-10-CM

## 2019-03-19 ENCOUNTER — Ambulatory Visit (INDEPENDENT_AMBULATORY_CARE_PROVIDER_SITE_OTHER): Payer: PRIVATE HEALTH INSURANCE | Admitting: Allergy

## 2019-03-19 ENCOUNTER — Other Ambulatory Visit: Payer: Self-pay

## 2019-03-19 ENCOUNTER — Encounter: Payer: Self-pay | Admitting: Allergy

## 2019-03-19 VITALS — BP 150/88 | HR 62 | Temp 97.6°F | Resp 18 | Ht 63.74 in | Wt 198.8 lb

## 2019-03-19 DIAGNOSIS — T781XXD Other adverse food reactions, not elsewhere classified, subsequent encounter: Secondary | ICD-10-CM | POA: Diagnosis not present

## 2019-03-19 DIAGNOSIS — J3089 Other allergic rhinitis: Secondary | ICD-10-CM

## 2019-03-19 DIAGNOSIS — R198 Other specified symptoms and signs involving the digestive system and abdomen: Secondary | ICD-10-CM | POA: Diagnosis not present

## 2019-03-19 DIAGNOSIS — T781XXA Other adverse food reactions, not elsewhere classified, initial encounter: Secondary | ICD-10-CM | POA: Insufficient documentation

## 2019-03-19 MED ORDER — AZELASTINE-FLUTICASONE 137-50 MCG/ACT NA SUSP: 1.0000 | Freq: Two times a day (BID) | NASAL | 5 refills | Status: DC

## 2019-03-19 NOTE — Progress Notes (Signed)
New Patient Note  RE: Helen Keller MRN: [id number] DOB: 09-Mar-1960 Date of Office Visit: 03/19/2019  Referring provider: No ref. provider found Primary care provider: Lennie Odor, [place]  Chief Complaint: Allergic Reaction (Foods???)  History of Present Illness: I had the pleasure of seeing Preslei Blakley for initial evaluation at the [facility] on 03/19/2019. She is a 59 y.o. female, who is self-referred here for the evaluation of possible food allergies.  Food: Patient has been having issues with getting "sick" after eating since she was a little child. Symptoms usually include nausea, abdominal pains, diarrhea, and belching. No other symptoms. Denies any associated pruritus, swelling, rash, respiratory issues.   She was seen by GI in the past and has not seen them in a few years. Patient had multiple EGDs/colonoscopies which were unremarkable. She was told she had IBS and treated with various medications without much benefit.  She also tried omeprazole for a short time with no benefit.   Most recently, she has been using OTC Digestzen supplement which contains various oils and this seems to help her the most to date. See below for ingredient list.   Symptoms usually come on within 1 hour after eating however sometimes she can have these issues first thing in the morning without having eaten anything for overnight.   Symptoms worse in the evenings and lasts for a few hour. Seems to improve after a bowel movement.   No specific food triggers noted but questioning if eggs, pasta, spaghetti may be a trigger.   Past work up includes: none.  Dietary History: patient has been eating other foods including milk, eggs, peanut, treenuts, sesame, shellfish, seafood, soy, wheat, meats, fruits and vegetables.    Assessment and Plan: Vitoria is a 59 y.o. female with: Gastrointestinal complaints GI issues with abdominal pains, diarrhea, nausea and belching since a child with  no specific triggers. Seems to be worse after eating and at night. However, symptoms can occur first thing in the morning with no food intake. Saw GI in the past and had multiple EGDs & colonoscopies which were unremarkable per patient report. Diagnosed with IBS and treated with various medications with no benefit. Currently using OTC Digestzen supplement with good benefit.  Today's skin testing showed: Negative to food panel and positive to dust mites.   Discussed with patient that her clinical history did not really support an IgE mediated reaction to foods which is what skin prick testing is good for.   She may have intolerances or functional issues with her digestive system causing her issues.   The morning GI issues may be contributed by possible PND from dust mite allergies.   Start dymista (fluticasone + azelastine nasal spray combination) 1 spray per nostril twice a day.  Nasal saline spray (i.e., Simply Saline) or nasal saline lavage (i.e., NeilMed) is recommended as needed and prior to medicated nasal sprays.  If symptoms not improved then add on zyrtec 10mg  1 tablet daily at night.  Keep a food journal with symptoms and bring in to the next visit.   Other allergic rhinitis Mild rhinitis symptoms treated with benadryl prn in the past with good benefit.  Today's skin testing was positive to dust mites only.  Start environmental control measures.   Start dymista (fluticasone + azelastine nasal spray combination) 1 spray per nostril twice a day.  Nasal saline spray (i.e., Simply Saline) or nasal saline lavage (i.e., NeilMed) is recommended as needed and prior to medicated  nasal sprays.  If symptoms not improved then add on zyrtec 10mg  1 tablet daily at night.  Return in about 4 weeks (around 04/16/2019).  Meds ordered this encounter  Medications  . Azelastine-Fluticasone (DYMISTA) 137-50 MCG/ACT SUSP    Sig: Place 1 spray into the nose 2 (two) times daily.    Dispense:  23  g    Refill:  5   Other allergy screening: Asthma: no Rhino conjunctivitis: yes  Rabbits caused some breathing issues.  Some issues with dust and cats. Takes benadryl prn Medication allergy: yes  Codeine - itching Hymenoptera allergy: large localized reactions Urticaria: no Eczema:no History of recurrent infections suggestive of immunodeficency: no  Diagnostics: Skin Testing: Environmental allergy panel and foods. Positive test to: dust mites. Negative test to: food panel.  Results discussed with patient/family. Airborne Adult Perc - 03/19/19 1105    Time Antigen Placed  1100    Allergen Manufacturer  Greer    Location  Back    Number of Test  59    Panel 1  Select    1. Control-Buffer 50% Glycerol  Negative    2. Control-Histamine 1 mg/ml  2+    3. Albumin saline  Negative    4. Bahia  Negative    5. 03/21/19  Negative    6. Johnson  Negative    7. Kentucky Blue  Negative    8. Meadow Fescue  Negative    9. Perennial Rye  Negative    10. Sweet Vernal  Negative    11. Timothy  Negative    12. Cocklebur  Negative    13. Burweed Marshelder  Negative    14. Ragweed, short  Negative    15. Ragweed, Giant  Negative    16. Plantain,  English  Negative    17. Lamb's Quarters  Negative    18. Sheep Sorrell  Negative    19. Rough Pigweed  Negative    20. Marsh Elder, Rough  Negative    21. Mugwort, Common  Negative    22. Ash mix  Negative    23. Birch mix  Negative    24. Beech American  Negative    25. Box, Elder  Negative    26. Cedar, red  Negative    27. Cottonwood, French Southern Territ[place]  Negative    28. Elm mix  Negative    29. Hickory mix  Negative    30. Maple mix  Negative    31. Oak, Guinea-Bissau mix  Negative    32. Pecan Pollen  Negative    33. Pine mix  Negative    34. Sycamore Eastern  Negative    35. Walnut, Black Pollen  Negative    36. Alternaria alternata  Negative    37. Cladosporium Herbarum  Negative    38. Aspergillus mix  Negative    39. Penicillium mix   Negative    40. Bipolaris sorokiniana (Helminthosporium)  Negative    41. Drechslera spicifera (Curvularia)  Negative    42. Mucor plumbeus  Negative    43. Fusarium moniliforme  Negative    44. Aureobasidium pullulans (pullulara)  Negative    45. Rhizopus oryzae  Negative    46. Botrytis cinera  Negative    47. Epicoccum nigrum  Negative    48. Phoma betae  Negative    49. Candida Albicans  Negative    50. Trichophyton mentagrophytes  Negative    51. Mite, D Farinae  5,000 AU/ml  3+  52. Mite, D Pteronyssinus  5,000 AU/ml  3+    53. Cat Hair 10,000 BAU/ml  Negative    54.  Dog Epithelia  Negative    55. Mixed Feathers  Negative    56. Horse Epithelia  Negative    57. Cockroach, German  Negative    58. Mouse  Negative    59. Tobacco Leaf  Negative     Food Adult Perc - 03/19/19 1100    Time Antigen Placed  1100    Allergen Manufacturer  Waynette ButteryGreer    Location  Back    Number of allergen test  72    Control-Histamine 1 mg/ml  2+    1. Peanut  Negative    2. Soybean  Negative    3. Wheat  Negative    4. Sesame  Negative    5. Milk, cow  Negative    6. Egg White, Chicken  Negative    7. Casein  Negative    8. Shellfish Mix  Negative    9. Fish Mix  Negative    10. Cashew  Negative    11. Pecan Food  Negative    12. Walnut Food  Negative    13. Almond  Negative    14. Hazelnut  Negative    15. EstoniaBrazil nut  Negative    16. Coconut  Negative    17. Pistachio  Negative    18. Catfish  Negative    19. Bass  Negative    20. Trout  Negative    21. Tuna  Negative    22. Salmon  Negative    23. Flounder  Negative    24. Codfish  Negative    25. Shrimp  Negative    26. Crab  Negative    27. Lobster  Negative    28. Oyster  Negative    29. Scallops  Negative    30. Barley  Negative    31. Oat   Negative    32. Rye   Negative    33. Hops  Negative    34. Rice  Negative    35. Cottonseed  Negative    36. Saccharomyces Cerevisiae   Negative    37. Pork  Negative    38.  Malawiurkey Meat  Negative    39. Chicken Meat  Negative    40. Beef  Negative    41. Lamb  Negative    42. Tomato  Negative    43. White Potato  Negative    44. Sweet Potato  Negative    45. Pea, Green/English  Negative    46. Navy Bean  Negative    47. Mushrooms  Negative    48. Avocado  Negative    49. Onion  Negative    50. Cabbage  Negative    51. Carrots  Negative    52. Celery  Negative    53. Corn  Negative    54. Cucumber  Negative    55. Grape (White seedless)  Negative    56. Orange   Negative    57. Banana  Negative    58. Apple  Negative    59. Peach  Negative    60. Strawberry  Negative    61. Cantaloupe  Negative    62. Watermelon  Negative    63. Pineapple  Negative    64. Chocolate/Cacao bean  Negative    65. Karaya Gum  Negative    66. Acacia (Arabic  Gum)  Negative    67. Cinnamon  Negative    68. Nutmeg  Negative    69. Ginger  Negative    70. Garlic  Negative    71. Pepper, black  Negative    72. Mustard  Negative       Past Medical History: Patient Active Problem List   Diagnosis Date Noted  . Other allergic rhinitis 03/19/2019  . Adverse food reaction 03/19/2019  . Gastrointestinal complaints 03/19/2019   Past Medical History:  Diagnosis Date  . Anxiety state, unspecified   . Asthma   . Colitis   . Depression   . GERD (gastroesophageal reflux disease)   . Palpitations   . Portal vein thrombosis 2009   Intrahepatic  . Shortness of breath    Past Surgical History: Past Surgical History:  Procedure Laterality Date  . BREAST EXCISIONAL BIOPSY Left    Medication List:  Current Outpatient Medications  Medication Sig Dispense Refill  . ALBUTEROL IN Inhale into the lungs.    . ALPRAZolam (XANAX) 0.25 MG tablet alprazolam 0.25 mg tablet    . Ascorbic Acid (VITAMIN C) 100 MG tablet Take 100 mg by mouth daily.    . calcium-vitamin D 250-100 MG-UNIT tablet Take 1 tablet by mouth 2 (two) times daily.    . meloxicam (MOBIC) 15 MG tablet  meloxicam 15 mg tablet  TAKE 1 TABLET TWICE A DAY FOR 7 DAYS ,THEN DAILY AS NEEDED FOR SWELLING/PAIN    . Multiple Vitamin (MULTIVITAMIN WITH MINERALS) TABS tablet Take 1 tablet by mouth daily.    Marland Kitchen thiamine (VITAMIN B-1) 50 MG tablet Take 50 mg by mouth daily.    . valACYclovir (VALTREX) 1000 MG tablet valacyclovir 1 gram tablet    . Azelastine-Fluticasone (DYMISTA) 137-50 MCG/ACT SUSP Place 1 spray into the nose 2 (two) times daily. 23 g 5   No current facility-administered medications for this visit.   Allergies: Allergies  Allergen Reactions  . Codeine     itching   Social History: Social History   Socioeconomic History  . Marital status: Married    Spouse name: Not on file  . Number of children: 4  . Years of education: Not on file  . Highest education level: Not on file  Occupational History  . 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 Smoking status: Never Smoker  . Smokeless tobacco: Never Used  Substance and Sexual Activity  . Alcohol use: No  . Drug use: No  . Sexual activity: Not on file  Other Topics Concern  . Not on file  Social History Narrative  . Not on file   Social Determinants of Health   Financial Resource Strain:   . Difficulty of Paying Living Expenses: Not on file  Food Insecurity:   . Worried About Programme researcher, broadcasting/film/video in the Last Year: Not on file  . Ran Out of Food in the Last Year: Not on file  Transportation Needs:   . Lack of Transportation (Medical): Not on file  . Lack of Transportation (Non-Medical): Not on file  Physical Activity:   . Days of Exercise per Week: Not on file  . Minutes of Exercise per Session: Not on file  Stress:   . Feeling of Stress : Not on file  Social Connections:   . Frequency of Communication with Friends and Family: Not on file  . Frequency of Social Gatherings with Friends and Family: Not on file  . Attends Religious Services: Not on file  .  Active Member of Clubs or Organizations: Not on file  .  Attends Banker Meetings: Not on file  . Marital Status: Not on file   Lives in a house built in 1998. Smoking: denies Occupation: Microbiologist HistorySurveyor, minerals in the house: no Engineer, civil (consulting) in the family room: no Carpet in the bedroom: yes Heating: gas and electric Cooling: central Pet: yes 3 dogs x 8 yrs, 4 yrs  Family History: Family History  Problem Relation Age of Onset  . Diabetes Mellitus I Father        deceased  . Coronary artery disease Father        deceased  . Angioedema Father   . Eczema Mother   . Allergic rhinitis Neg Hx   . Asthma Neg Hx   . Urticaria Neg Hx    Review of Systems  Constitutional: Negative for appetite change, chills, fever and unexpected weight change.  HENT: Negative for congestion and rhinorrhea.   Eyes: Negative for itching.  Respiratory: Negative for cough, chest tightness, shortness of breath and wheezing.   Cardiovascular: Negative for chest pain.  Gastrointestinal: Positive for abdominal pain and nausea.  Genitourinary: Negative for difficulty urinating.  Skin: Negative for rash.  Allergic/Immunologic: Positive for environmental allergies.  Neurological: Negative for headaches.   Objective: BP (!) 150/88 (BP Location: Left Arm, Patient Position: Sitting, Cuff Size: Normal)   Pulse 62   Temp 97.6 F (36.4 C) (Temporal)   Resp 18   Ht 5' 3.74" (1.619 m)   Wt 198 lb 12.8 oz (90.2 kg)   SpO2 96%   BMI 34.40 kg/m  Body mass index is 34.4 kg/m. Physical Exam  Constitutional: She is oriented to person, place, and time. She appears well-developed and well-nourished.  HENT:  Head: Normocephalic and atraumatic.  Right Ear: External ear normal.  Left Ear: External ear normal.  Nose: Mucosal edema present.  Mouth/Throat: Oropharynx is clear and moist.  Eyes: Conjunctivae and EOM are normal.  Cardiovascular: Normal rate, regular rhythm and normal heart sounds. Exam reveals no gallop and no friction rub.   No murmur heard. Pulmonary/Chest: Effort normal and breath sounds normal. She has no wheezes. She has no rales.  Abdominal: Soft.  Musculoskeletal:     Cervical back: Neck supple.  Neurological: She is alert and oriented to person, place, and time.  Skin: Skin is warm. No rash noted.  Psychiatric: She has a normal mood and affect. Her behavior is normal.  Nursing note and vitals reviewed.  The plan was reviewed with the patient/family, and all questions/concerned were addressed.  It was my pleasure to see Marni today and participate in her care. Please feel free to contact me with any questions or concerns.  Sincerely,  Wyline Mood, DO Allergy & Immunology  [facility] office: [phone] [facility] office: [phone] [place] office: [phone]

## 2019-03-19 NOTE — Assessment & Plan Note (Addendum)
GI issues with abdominal pains, diarrhea, nausea and belching since a child with no specific triggers. Seems to be worse after eating and at night. However, symptoms can occur first thing in the morning with no food intake. Saw GI in the past and had multiple EGDs & colonoscopies which were unremarkable per patient report. Diagnosed with IBS and treated with various medications with no benefit. Currently using OTC Digestzen supplement with good benefit.  Today's skin testing showed: Negative to food panel and positive to dust mites.   Discussed with patient that her clinical history did not really support an IgE mediated reaction to foods which is what skin prick testing is good for.   She may have intolerances or functional issues with her digestive system causing her issues.   The morning GI issues may be contributed by possible PND from dust mite allergies.   Start dymista (fluticasone + azelastine nasal spray combination) 1 spray per nostril twice a day.  Nasal saline spray (i.e., Simply Saline) or nasal saline lavage (i.e., NeilMed) is recommended as needed and prior to medicated nasal sprays.  If symptoms not improved then add on zyrtec 10mg  1 tablet daily at night.  Keep a food journal with symptoms and bring in to the next visit.

## 2019-03-19 NOTE — Patient Instructions (Addendum)
Today's skin testing showed: Positive to dust mites. Negative to food panel   Start dymista (fluticasone + azelastine nasal spray combination) 1 spray per nostril twice a day.  Nasal saline spray (i.e., Simply Saline) or nasal saline lavage (i.e., NeilMed) is recommended as needed and prior to medicated nasal sprays.  If symptoms not improved then add on zyrtec 10mg  1 tablet daily at night.  Keep a food journal with symptoms and bring in to the next visit.   Start environmental control measures.  Follow up in 4 weeks or sooner if needed.  Control of House Dust Mite Allergen . Dust mite allergens are a common trigger of allergy and asthma symptoms. While they can be found throughout the house, these microscopic creatures thrive in warm, humid environments such as bedding, upholstered furniture and carpeting. . Because so much time is spent in the bedroom, it is essential to reduce mite levels there.  . Encase pillows, mattresses, and box springs in special allergen-proof fabric covers or airtight, zippered plastic covers.  . Bedding should be washed weekly in hot water (130 F) and dried in a hot dryer. Allergen-proof covers are available for comforters and pillows that can't be regularly washed.  the allergy-proof covers every few months. Minimize clutter in the bedroom. Keep pets out of the bedroom.  Helen Keller Keep humidity less than 50% by using a dehumidifier or air conditioning. You can buy a humidity measuring device called a hygrometer to monitor this.  . If possible, replace carpets with hardwood, linoleum, or washable area rugs. If that's not possible, vacuum frequently with a vacuum that has a HEPA filter. . Remove all upholstered furniture and non-washable window drapes from the bedroom. . Remove all non-washable stuffed toys from the bedroom.  Wash stuffed toys weekly.  Skin care recommendations  Bath time: . Always use lukewarm water. AVOID very hot or cold water. Marland Kitchen Keep  bathing time to 5-10 minutes. . Do NOT use bubble bath. . Use a mild soap and use just enough to wash the dirty areas. . Do NOT scrub skin vigorously.  . After bathing, pat dry your skin with a towel. Do NOT rub or scrub the skin.  Moisturizers and prescriptions:  . ALWAYS apply moisturizers immediately after bathing (within 3 minutes). This helps to lock-in moisture. . Use the moisturizer several times a day over the whole body. Marland Kitchen summer moisturizers include: Aveeno, CeraVe, Cetaphil. [place] winter moisturizers include: Aquaphor, Vaseline, Cerave, Cetaphil, Eucerin, Vanicream. . When using moisturizers along with medications, the moisturizer should be applied about one hour after applying the medication to prevent diluting effect of the medication or moisturize around where you applied the medications. When not using medications, the moisturizer can be continued twice daily as maintenance.  Laundry and clothing: . Avoid laundry products with added color or perfumes. . Use unscented hypo-allergenic laundry products such as Tide free, Cheer free & gentle, and All free and clear.  . If the skin still seems dry or sensitive, you can try double-rinsing the clothes. . Avoid tight or scratchy clothing such as wool. . Do not use fabric softeners or dyer sheets.

## 2019-03-19 NOTE — Assessment & Plan Note (Signed)
Mild rhinitis symptoms treated with benadryl prn in the past with good benefit.  Today's skin testing was positive to dust mites only.  Start environmental control measures.   Start dymista (fluticasone + azelastine nasal spray combination) 1 spray per nostril twice a day.  Nasal saline spray (i.e., Simply Saline) or nasal saline lavage (i.e., NeilMed) is recommended as needed and prior to medicated nasal sprays.  If symptoms not improved then add on zyrtec 10mg  1 tablet daily at night.

## 2019-04-23 ENCOUNTER — Ambulatory Visit: Payer: PRIVATE HEALTH INSURANCE | Admitting: Allergy

## 2019-04-23 NOTE — Progress Notes (Deleted)
Follow Up Note  RE: Helen Keller MRN: [id number] DOB: 12-Aug-1960 Date of Office Visit: 04/23/2019  Referring provider: Lennie Odor, PA Primary care provider: Lennie Odor, PA  Chief Complaint: No chief complaint on file.  History of Present Illness: I had the pleasure of seeing Helen Keller for a follow up visit at the [facility] on 04/23/2019. She is a 59 y.o. female, who is being followed for allergic rhinitis and gastrointestinal complaints. Her previous allergy office visit was on 03/19/2019 with Dr. Maudie Mercury. Today is a regular follow up visit.  Gastrointestinal complaints GI issues with abdominal pains, diarrhea, nausea and belching since a child with no specific triggers. Seems to be worse after eating and at night. However, symptoms can occur first thing in the morning with no food intake. Saw GI in the past and had multiple EGDs & colonoscopies which were unremarkable per patient report. Diagnosed with IBS and treated with various medications with no benefit. Currently using OTC Digestzen supplement with good benefit.  Today's skin testing showed: Negative to food panel and positive to dust mites.   Discussed with patient that her clinical history did not really support an IgE mediated reaction to foods which is what skin prick testing is good for.   She may have intolerances or functional issues with her digestive system causing her issues.   The morning GI issues may be contributed by possible PND from dust mite allergies.   Start dymista (fluticasone + azelastine nasal spray combination) 1 spray per nostril twice a day. ? Nasal saline spray (i.e., Simply Saline) or nasal saline lavage (i.e., NeilMed) is recommended as needed and prior to medicated nasal sprays.  If symptoms not improved then add on zyrtec 10mg  1 tablet daily at night.  Keep a food journal with symptoms and bring in to the next visit.   Other allergic rhinitis Mild rhinitis symptoms  treated with benadryl prn in the past with good benefit.  Today's skin testing was positive to dust mites only.  Start environmental control measures.   Start dymista (fluticasone + azelastine nasal spray combination) 1 spray per nostril twice a day.  Nasal saline spray (i.e., Simply Saline) or nasal saline lavage (i.e., NeilMed) is recommended as needed and prior to medicated nasal sprays.  If symptoms not improved then add on zyrtec 10mg  1 tablet daily at night.  Return in about 4 weeks (around 04/16/2019).  Assessment and Plan: Helen Keller is a 59 y.o. female with: No problem-specific Assessment & Plan notes found for this encounter.  No follow-ups on file.  No orders of the defined types were placed in this encounter.  Lab Orders  No laboratory test(s) ordered today    Diagnostics: Spirometry:  Tracings reviewed. Her effort: {Blank single:19197::"Good reproducible efforts.","It was hard to get consistent efforts and there is a question as to whether this reflects a maximal maneuver.","Poor effort, data can not be interpreted."} FVC: ***L FEV1: ***L, ***% predicted FEV1/FVC ratio: ***% Interpretation: {Blank single:19197::"Spirometry consistent with mild obstructive disease","Spirometry consistent with moderate obstructive disease","Spirometry consistent with severe obstructive disease","Spirometry consistent with possible restrictive disease","Spirometry consistent with mixed obstructive and restrictive disease","Spirometry uninterpretable due to technique","Spirometry consistent with normal pattern","No overt abnormalities noted given today's efforts"}.  Please see scanned spirometry results for details.  Skin Testing: {Blank single:19197::"Select foods","Environmental allergy panel","Environmental allergy panel and select foods","Food allergy panel","None","Deferred due to recent antihistamines use"}. Positive test to: ***. Negative test to: ***.  Results discussed with  patient/family.  Medication List:  Current Outpatient Medications  Medication Sig Dispense Refill  . ALBUTEROL IN Inhale into the lungs.    . ALPRAZolam (XANAX) 0.25 MG tablet alprazolam 0.25 mg tablet    . Ascorbic Acid (VITAMIN C) 100 MG tablet Take 100 mg by mouth daily.    . Azelastine-Fluticasone (DYMISTA) 137-50 MCG/ACT SUSP Place 1 spray into the nose 2 (two) times daily. 23 g 5  . calcium-vitamin D 250-100 MG-UNIT tablet Take 1 tablet by mouth 2 (two) times daily.    . meloxicam (MOBIC) 15 MG tablet meloxicam 15 mg tablet  TAKE 1 TABLET TWICE A DAY FOR 7 DAYS ,THEN DAILY AS NEEDED FOR SWELLING/PAIN    . Multiple Vitamin (MULTIVITAMIN WITH MINERALS) TABS tablet Take 1 tablet by mouth daily.    Marland Kitchen thiamine (VITAMIN B-1) 50 MG tablet Take 50 mg by mouth daily.    . valACYclovir (VALTREX) 1000 MG tablet valacyclovir 1 gram tablet     No current facility-administered medications for this visit.   Allergies: Allergies  Allergen Reactions  . Codeine     itching   I reviewed her past medical history, social history, family history, and environmental history and no significant changes have been reported from her previous visit.  Review of Systems  Constitutional: Negative for appetite change, chills, fever and unexpected weight change.  HENT: Negative for congestion and rhinorrhea.   Eyes: Negative for itching.  Respiratory: Negative for cough, chest tightness, shortness of breath and wheezing.   Cardiovascular: Negative for chest pain.  Gastrointestinal: Positive for abdominal pain and nausea.  Genitourinary: Negative for difficulty urinating.  Skin: Negative for rash.  Allergic/Immunologic: Positive for environmental allergies.  Neurological: Negative for headaches.   Objective: There were no vitals taken for this visit. There is no height or weight on file to calculate BMI. Physical Exam  Constitutional: She is oriented to person, place, and time. She appears  well-developed and well-nourished.  HENT:  Head: Normocephalic and atraumatic.  Right Ear: External ear normal.  Left Ear: External ear normal.  Nose: Mucosal edema present.  Mouth/Throat: Oropharynx is clear and moist.  Eyes: Conjunctivae and EOM are normal.  Cardiovascular: Normal rate, regular rhythm and normal heart sounds. Exam reveals no gallop and no friction rub.  No murmur heard. Pulmonary/Chest: Effort normal and breath sounds normal. She has no wheezes. She has no rales.  Abdominal: Soft.  Musculoskeletal:     Cervical back: Neck supple.  Neurological: She is alert and oriented to person, place, and time.  Skin: Skin is warm. No rash noted.  Psychiatric: She has a normal mood and affect. Her behavior is normal.  Nursing note and vitals reviewed.  Previous notes and tests were reviewed. The plan was reviewed with the patient/family, and all questions/concerned were addressed.  It was my pleasure to see Helen Keller today and participate in her care. Please feel free to contact me with any questions or concerns.  Sincerely,  Wyline Mood, DO Allergy & Immunology  [facility] office: [phone] [facility] office: [phone] [place] office: [phone]

## 2019-07-30 ENCOUNTER — Other Ambulatory Visit: Payer: Self-pay | Admitting: Obstetrics and Gynecology

## 2019-07-30 DIAGNOSIS — R591 Generalized enlarged lymph nodes: Secondary | ICD-10-CM

## 2019-08-14 ENCOUNTER — Ambulatory Visit
Admission: RE | Admit: 2019-08-14 | Discharge: 2019-08-14 | Disposition: A | Discharge status: Home / Self Care | Payer: PRIVATE HEALTH INSURANCE | Source: Ambulatory Visit | Attending: Obstetrics and Gynecology | Admitting: Obstetrics and Gynecology

## 2019-08-14 ENCOUNTER — Other Ambulatory Visit: Payer: Self-pay

## 2019-08-14 ENCOUNTER — Ambulatory Visit: Payer: PRIVATE HEALTH INSURANCE

## 2019-08-14 DIAGNOSIS — R591 Generalized enlarged lymph nodes: Secondary | ICD-10-CM

## 2019-10-15 ENCOUNTER — Ambulatory Visit: Payer: PRIVATE HEALTH INSURANCE | Admitting: Dietician

## 2019-12-17 ENCOUNTER — Ambulatory Visit
Admission: RE | Admit: 2019-12-17 | Discharge: 2019-12-17 | Disposition: A | Discharge status: Home / Self Care | Payer: PRIVATE HEALTH INSURANCE | Source: Ambulatory Visit | Attending: Physician Assistant | Admitting: Physician Assistant

## 2019-12-17 ENCOUNTER — Other Ambulatory Visit: Payer: Self-pay | Admitting: Physician Assistant

## 2019-12-17 DIAGNOSIS — R0602 Shortness of breath: Secondary | ICD-10-CM

## 2020-12-01 ENCOUNTER — Other Ambulatory Visit: Payer: Self-pay

## 2020-12-01 ENCOUNTER — Encounter (HOSPITAL_BASED_OUTPATIENT_CLINIC_OR_DEPARTMENT_OTHER): Payer: Self-pay

## 2020-12-01 ENCOUNTER — Emergency Department (HOSPITAL_BASED_OUTPATIENT_CLINIC_OR_DEPARTMENT_OTHER)
Admission: EM | Admit: 2020-12-01 | Discharge: 2020-12-01 | Disposition: A | Discharge status: Home / Self Care | Payer: PRIVATE HEALTH INSURANCE | Attending: Emergency Medicine | Admitting: Emergency Medicine

## 2020-12-01 ENCOUNTER — Emergency Department (HOSPITAL_BASED_OUTPATIENT_CLINIC_OR_DEPARTMENT_OTHER): Payer: PRIVATE HEALTH INSURANCE

## 2020-12-01 DIAGNOSIS — J45909 Unspecified asthma, uncomplicated: Secondary | ICD-10-CM | POA: Diagnosis not present

## 2020-12-01 DIAGNOSIS — M79604 Pain in right leg: Secondary | ICD-10-CM | POA: Diagnosis present

## 2020-12-01 DIAGNOSIS — M25561 Pain in right knee: Secondary | ICD-10-CM | POA: Diagnosis not present

## 2020-12-01 DIAGNOSIS — Z7951 Long term (current) use of inhaled steroids: Secondary | ICD-10-CM | POA: Insufficient documentation

## 2020-12-01 DIAGNOSIS — G8929 Other chronic pain: Secondary | ICD-10-CM | POA: Diagnosis not present

## 2020-12-01 NOTE — ED Provider Notes (Signed)
[facility] EMERGENCY DEPT Provider Note   CSN: [id number] Arrival date & time: 12/01/20  1722     History Chief Complaint  Patient presents with   Leg Pain    Helen Keller is a 60 y.o. female with a past medical history of anxiety, asthma, GERD, palpitations, who presents today for evaluation at the recommendation of [facility] walk[facility]in [facility]. She has a note with her stating that there is concern for a right-sided DVT. Patient has chronic pain in her right knee and is supposed to have a knee replacement per her report.  She states that over the past few weeks the pain has been worsening and is now radiating down into her ankle and up into her groin which is what originally caused her to seek care.  She denies any cough, no personal history of DVT/PE.  She is not anticoagulated.  She denies any fevers.  She is ambulatory however states it is painful.  HPI     Past Medical History:  Diagnosis Date   Anxiety state, unspecified    Asthma    Colitis    Depression    GERD (gastroesophageal reflux disease)    Palpitations    Portal vein thrombosis 2009   Intrahepatic   Shortness of breath     Patient Active Problem List   Diagnosis Date Noted   Other allergic rhinitis 03/19/2019   Adverse food reaction 03/19/2019   Gastrointestinal complaints 03/19/2019    Past Surgical History:  Procedure Laterality Date   BREAST EXCISIONAL BIOPSY Left      OB History   No obstetric history on file.     Family History  Problem Relation Age of Onset   Diabetes Mellitus I Father        deceased   Coronary artery disease Father        deceased   Angioedema Father    Eczema Mother    Allergic rhinitis Neg Hx    Asthma Neg Hx    Urticaria Neg Hx     Social History   Tobacco Use   Smoking status: Never   Smokeless tobacco: Never  Vaping Use   Vaping Use: Never used  Substance Use Topics   Alcohol use: No   Drug use: No    Home Medications Prior to  Admission medications   Medication Sig Start Date End Date Taking? Authorizing Provider  ALBUTEROL IN Inhale into the lungs.    [provider]  ALPRAZolam Prudy Feeler) 0.25 MG tablet alprazolam 0.25 mg tablet    [provider]  Ascorbic Acid (VITAMIN C) 100 MG tablet Take 100 mg by mouth daily.    [provider]  Azelastine-Fluticasone (DYMISTA) 137-50 MCG/ACT SUSP Place 1 spray into the nose 2 (two) times daily. 03/19/19   Ellamae Sia, DO  calcium-vitamin D 250-100 MG-UNIT tablet Take 1 tablet by mouth 2 (two) times daily.    [provider]  meloxicam (MOBIC) 15 MG tablet meloxicam 15 mg tablet  TAKE 1 TABLET TWICE A DAY FOR 7 DAYS ,THEN DAILY AS NEEDED FOR SWELLING/PAIN    [provider]  Multiple Vitamin (MULTIVITAMIN WITH MINERALS) TABS tablet Take 1 tablet by mouth daily.    [provider]  thiamine (VITAMIN B-1) 50 MG tablet Take 50 mg by mouth daily.    [provider]  valACYclovir (VALTREX) 1000 MG tablet valacyclovir 1 gram tablet    [provider]    Allergies    Codeine  Review of Systems   Review of Systems  Constitutional:  Negative for chills and fever.  Musculoskeletal:        Increasing right leg pain  Skin:  Negative for color change, rash and wound.  Neurological:  Negative for weakness and numbness.  All other systems reviewed and are negative.  Physical Exam Updated Vital Signs BP 138/72 (BP Location: Right Arm)   Pulse 64   Temp 98.8 F (37.1 C) (Oral)   Resp 16   Ht 5\' 3"  (1.6 m)   Wt 88.5 kg   SpO2 96%   BMI 34.54 kg/m   Physical Exam Vitals and nursing note reviewed.  Constitutional:      General: She is not in acute distress.    Appearance: She is not diaphoretic.  HENT:     Head: Normocephalic and atraumatic.  Eyes:     General: No scleral icterus.       Right eye: No discharge.        Left eye: No discharge.     Conjunctiva/sclera: Conjunctivae normal.   Cardiovascular:     Rate and Rhythm: Normal rate and regular rhythm.     Comments: 2+ DP/PT pulses bilaterally.  Bilateral feet are warm with brisk capillary refill and appears well-perfused Pulmonary:     Effort: Pulmonary effort is normal. No respiratory distress.     Breath sounds: No stridor.  Abdominal:     General: There is no distension.  Musculoskeletal:        General: No deformity.     Cervical back: Normal range of motion.     Comments: There is mild edema around the right knee anteriorly.  There is no abnormal induration or erythema over the right knee.  She is able to flex and extend the knee without significant pain or difficulty.  Mild tenderness to palpation of the right calf.  Skin:    General: Skin is warm and dry.  Neurological:     Mental Status: She is alert.     Sensory: No sensory deficit.     Motor: No abnormal muscle tone.  Psychiatric:        Behavior: Behavior normal.    ED Results / Procedures / Treatments   Labs (all labs ordered are listed, but only abnormal results are displayed) Labs Reviewed - No data to display  EKG None  Radiology Venous Img Lower Unilateral Right (DVT)  Result Date: 12/01/2020 CLINICAL DATA:  Right leg pain. EXAM: RIGHT LOWER EXTREMITY VENOUS DOPPLER ULTRASOUND TECHNIQUE: Gray-scale sonography with compression, as well as color and duplex ultrasound, were performed to evaluate the deep venous system(s) from the level of the common femoral vein through the popliteal and proximal calf veins. COMPARISON:  None. FINDINGS: VENOUS Normal compressibility of the common femoral, superficial femoral, and popliteal veins, as well as the visualized calf veins. Visualized portions of profunda femoral vein and great saphenous vein unremarkable. No filling defects to suggest DVT on grayscale or color Doppler imaging. Doppler waveforms show normal direction of venous flow, normal respiratory plasticity and response to augmentation. Limited  views of the contralateral common femoral vein are unremarkable. OTHER None. Limitations: none IMPRESSION: Negative. Electronically Signed   By: 12/03/2020 M.D.   On: 12/01/2020 19:06    Procedures Procedures   Medications Ordered in ED Medications - No data to display  ED Course  I have reviewed the triage vital signs and the nursing notes.  Pertinent labs & imaging results that were  available during my care of the patient were reviewed by me and considered in my medical decision making (see chart for details).    MDM Rules/Calculators/A&P                          Patient is a 60 year old woman with chronic right knee pain who presents today at the recommendation of her orthopedics for evaluation of DVT. DVT study was negative. Patient states that she needs a knee replacement, and that her pain has been worsening. Clinically on exam she does not appear to have septic arthritis, she is afebrile, she does not have significant abnormal warmth, erythema/edema, is able to ambulate and bear weight and flex and extend her knee.  Recommended orthopedic follow-up.  Return precautions were discussed with patient who states their understanding.  At the time of discharge patient denied any unaddressed complaints or concerns.  Patient is agreeable for discharge home.  Note: Portions of this report may have been transcribed using voice recognition software. Every effort was made to ensure accuracy; however, inadvertent computerized transcription errors may be present   Final Clinical Impression(s) / ED Diagnoses Final diagnoses:  Right leg pain    Rx / DC Orders ED Discharge Orders     None        Norman Clay 12/01/20 2151    Milagros Loll, MD 12/02/20 2144

## 2020-12-01 NOTE — ED Triage Notes (Signed)
Pt reports chronic right sided knee pain and is supposed to have knee replacement. She now endorses pain radiating down leg, to ankle, and groin. Pt sent here for venous US.

## 2020-12-01 NOTE — Discharge Instructions (Addendum)
Today your ultrasound did not show any evidence of a DVT or a deep vein blood clot.  Please call your team tomorrow to let them know that this was normal.  As it is normal you do not require additional treatment. If you develop fevers, worsening pain, significant abnormal redness, or have any other concerns please seek additional medical care and evaluation.  While in the ED your blood pressure was high.  Please follow up with your primary care doctor or the wellness clinic for repeat evaluation as you may need medication.  High blood pressure can cause long term, potentially serious, damage if left untreated.   Please call your orthopedic team tomorrow to let them know you didn't have a DVT on your ultrasound.

## 2021-01-31 NOTE — Patient Instructions (Signed)
DUE TO COVID-19 ONLY ONE VISITOR IS ALLOWED TO COME WITH YOU AND STAY IN THE WAITING ROOM ONLY DURING PRE OP AND PROCEDURE.   **NO VISITORS ARE ALLOWED IN THE SHORT STAY AREA OR RECOVERY ROOM!!**  IF YOU WILL BE ADMITTED INTO THE HOSPITAL YOU ARE ALLOWED ONLY TWO SUPPORT PEOPLE DURING VISITATION HOURS ONLY (7 AM -8PM)   The support person(s) must pass our screening, gel in and out, and wear a mask at all times, including in the patients room. Patients must also wear a mask when staff or their support person are in the room. Visitors GUEST BADGE MUST BE WORN VISIBLY  One adult visitor may remain with you overnight and MUST be in the room by 8 P.M.  No visitors under the age of 38. Any visitor under the age of 58 must be accompanied by an adult.    COVID SWAB TESTING MUST BE COMPLETED ON: 02/10/21  **MUST PRESENT COMPLETED FORM AT TESTING SITE**    [address] [place] (backside of the building) You are not required to quarantine, however you are required to wear a well-fitted mask when you are out and around people not in your household.  Hand Hygiene often Do NOT share personal items Notify your provider if you are in close contact with someone who has COVID or you develop fever 100.4 or greater, new onset of sneezing, cough, sore throat, shortness of breath or body aches.  [facility] Entrance [address], [address], must go inside of the hospital, NOT A DRIVE THRU!  (Must self quarantine after testing. Follow instructions on handout.)       Your procedure is scheduled on: 02/14/21   Report to [facility] Main Entrance    Report to admitting at: 12:00 PM   Call this number if you have problems the morning of surgery [phone]   Do not eat food :After Midnight.   May have liquids until : 11:15 AM   day of surgery  CLEAR LIQUID DIET  Foods Allowed                                                                      Foods Excluded  Water, Black Coffee and tea, regular and decaf                             liquids that you cannot  Plain Jell-O in any flavor  (No red)                                           see through such as: Fruit ices (not with fruit pulp)                                     milk, soups, orange juice              Iced Popsicles (No red)  All solid food                                   Apple juices Sports drinks like Gatorade (No red) Lightly seasoned clear broth or consume(fat free) Sugar  Sample Menu Breakfast                                Lunch                                     Supper Cranberry juice                    Beef broth                            Chicken broth Jell-O                                     Grape juice                           Apple juice Coffee or tea                        Jell-O                                      Popsicle                                                Coffee or tea                        Coffee or tea      Complete one Gatorade  drink the morning of surgery at : 11:15 AM      the day of surgery.     The day of surgery:  Drink ONE (1) Pre-Surgery Clear Ensure or G2 by am the morning of surgery. Drink in one sitting. Do not sip.  This drink was given to you during your hospital  pre-op appointment visit. Nothing else to drink after completing the  Pre-Surgery Clear Ensure or G2.          If you have questions, please contact your surgeons office.     Oral Hygiene is also important to reduce your risk of infection.                                    Remember - BRUSH YOUR TEETH THE MORNING OF SURGERY WITH YOUR REGULAR TOOTHPASTE   Do NOT smoke after Midnight   Take these medicines the morning of surgery with A SIP OF WATER: Xanax as needed. How to Manage Your Diabetes Before and After Surgery  Why is it important to control my blood sugar before and after surgery? Improving blood  sugar levels before and after surgery helps healing and can limit problems. A way of improving blood sugar control is eating a healthy diet by:  Eating less sugar and carbohydrates  Increasing activity/exercise  Talking with your doctor about reaching your blood sugar goals High blood sugars (greater than 180 mg/dL) can raise your risk of infections and slow your recovery, so you will need to focus on controlling your diabetes during the weeks before surgery. Make sure that the doctor who takes care of your diabetes knows about your planned surgery including the date and location.  How do I manage my blood sugar before surgery? Check your blood sugar at least 4 times a day, starting 2 days before surgery, to make sure that the level is not too high or low. Check your blood sugar the morning of your surgery when you wake up and every 2 hours until you get to the Short Stay unit. If your blood sugar is less than 70 mg/dL, you will need to treat for low blood sugar: Do not take insulin. Treat a low blood sugar (less than 70 mg/dL) with  cup of clear juice (cranberry or apple), 4 glucose tablets, OR glucose gel. Recheck blood sugar in 15 minutes after treatment (to make sure it is greater than 70 mg/dL). If your blood sugar is not greater than 70 mg/dL on recheck, call [phone] for further instructions. Report your blood sugar to the short stay nurse when you get to Short Stay.  If you are admitted to the hospital after surgery: Your blood sugar will be checked by the staff and you will probably be given insulin after surgery (instead of oral diabetes medicines) to make sure you have good blood sugar levels. The goal for blood sugar control after surgery is 80-180 mg/dL.   WHAT DO I DO ABOUT MY DIABETES MEDICATION?  Do not take oral diabetes medicines (pills) the morning of surgery.  THE DAY BEFORE SURGERY, DO NOT TAKE ANY ORAL DIABETIC MEDICATIONS DAY OF YOUR SURGERY                               You may not have any metal on your body including hair pins, jewelry, and body piercing             Do not wear make-up, lotions, powders, perfumes/cologne, or deodorant  Do not wear nail polish including gel and S&S, artificial/acrylic nails, or any other type of covering on natural nails including finger and toenails. If you have artificial nails, gel coating, etc. that needs to be removed by a nail salon please have this removed prior to surgery or surgery may need to be canceled/ delayed if the surgeon/ anesthesia feels like they are unable to be safely monitored.   Do not shave  48 hours prior to surgery.    Do not bring valuables to the hospital. [facility] IS NOT             RESPONSIBLE   FOR VALUABLES.   Contacts, dentures or bridgework may not be worn into surgery.   Bring small overnight bag day of surgery.    Patients discharged on the day of surgery will not be allowed to drive home.   Special Instructions: Bring a copy of your healthcare power of attorney and living will documents         the day of surgery if you haven't scanned them before.  Please read over the following fact sheets you were given: IF YOU HAVE QUESTIONS ABOUT YOUR PRE-OP INSTRUCTIONS PLEASE CALL [phone]     [facility] - Preparing for Surgery Before surgery, you can play an important role.  Because skin is not sterile, your skin needs to be as free of germs as possible.  You can reduce the number of germs on your skin by washing with CHG (chlorahexidine gluconate) soap before surgery.  CHG is an antiseptic cleaner which kills germs and bonds with the skin to continue killing germs even after washing. Please DO NOT use if you have an allergy to CHG or antibacterial soaps.  If your skin becomes reddened/irritated stop using the CHG and inform your nurse when you arrive at Short Stay. Do not shave (including legs and underarms) for at least 48 hours prior to the first CHG shower.  You  may shave your face/neck. Please follow these instructions carefully:  1.  Shower with CHG Soap the night before surgery and the  morning of Surgery.  2.  If you choose to wash your hair, wash your hair first as usual with your  normal  shampoo.  3.  After you shampoo, rinse your hair and body thoroughly to remove the  shampoo.                           4.  Use CHG as you would any other liquid soap.  You can apply chg directly  to the skin and wash                       Gently with a scrungie or clean washcloth.  5.  Apply the CHG Soap to your body ONLY FROM THE NECK DOWN.   Do not use on face/ open                           Wound or open sores. Avoid contact with eyes, ears mouth and genitals (private parts).                       Wash face,  Genitals (private parts) with your normal soap.             6.  Wash thoroughly, paying special attention to the area where your surgery  will be performed.  7.  Thoroughly rinse your body with warm water from the neck down.  8.  DO NOT shower/wash with your normal soap after using and rinsing off  the CHG Soap.                9.  Pat yourself dry with a clean towel.            10.  Wear clean pajamas.            11.  Place clean sheets on your bed the night of your first shower and do not  sleep with pets. Day of Surgery : Do not apply any lotions/deodorants the morning of surgery.  Please wear clean clothes to the hospital/surgery center.  FAILURE TO FOLLOW THESE INSTRUCTIONS MAY RESULT IN THE CANCELLATION OF YOUR SURGERY PATIENT SIGNATURE_________________________________  NURSE SIGNATURE__________________________________  ________________________________________________________________________   Rogelia Mire  An incentive spirometer is a tool that can help keep your lungs clear and active. This tool measures how well you are filling your lungs with each breath.  Taking long deep breaths may help reverse or decrease the chance of developing  breathing (pulmonary) problems (especially infection) following: A long period of time when you are unable to move or be active. BEFORE THE PROCEDURE  If the spirometer includes an indicator to show your best effort, your nurse or respiratory therapist will set it to a desired goal. If possible, sit up straight or lean slightly forward. Try not to slouch. Hold the incentive spirometer in an upright position. INSTRUCTIONS FOR USE  Sit on the edge of your bed if possible, or sit up as far as you can in bed or on a chair. Hold the incentive spirometer in an upright position. Breathe out normally. Place the mouthpiece in your mouth and seal your lips tightly around it. Breathe in slowly and as deeply as possible, raising the piston or the ball toward the top of the column. Hold your breath for 3-5 seconds or for as long as possible. Allow the piston or ball to fall to the bottom of the column. Remove the mouthpiece from your mouth and breathe out normally. Rest for a few seconds and repeat Steps 1 through 7 at least 10 times every 1-2 hours when you are awake. Take your time and take a few normal breaths between deep breaths. The spirometer may include an indicator to show your best effort. Use the indicator as a goal to work toward during each repetition. After each set of 10 deep breaths, practice coughing to be sure your lungs are clear. If you have an incision (the cut made at the time of surgery), support your incision when coughing by placing a pillow or rolled up towels firmly against it. Once you are able to get out of bed, walk around indoors and cough well. You may stop using the incentive spirometer when instructed by your caregiver.  RISKS AND COMPLICATIONS Take your time so you do not get dizzy or light-headed. If you are in pain, you may need to take or ask for pain medication before doing incentive spirometry. It is harder to take a deep breath if you are having pain. AFTER USE Rest  and breathe slowly and easily. It can be helpful to keep track of a log of your progress. Your caregiver can provide you with a simple table to help with this. If you are using the spirometer at home, follow these instructions: SEEK MEDICAL CARE IF:  You are having difficultly using the spirometer. You have trouble using the spirometer as often as instructed. Your pain medication is not giving enough relief while using the spirometer. You develop fever of 100.5 F (38.1 C) or higher. SEEK IMMEDIATE MEDICAL CARE IF:  You cough up bloody sputum that had not been present before. You develop fever of 102 F (38.9 C) or greater. You develop worsening pain at or near the incision site. MAKE SURE YOU:  Understand these instructions. Will watch your condition. Will get help right away if you are not doing well or get worse. Document Released: 06/18/2006 Document Revised: 04/30/2011 Document Reviewed: 08/19/2006 [facility] Patient Information 2014 [place], [place].   ________________________________________________________________________

## 2021-02-01 ENCOUNTER — Other Ambulatory Visit: Payer: Self-pay

## 2021-02-01 ENCOUNTER — Encounter (HOSPITAL_COMMUNITY): Payer: Self-pay

## 2021-02-01 ENCOUNTER — Encounter (HOSPITAL_COMMUNITY)
Admission: RE | Admit: 2021-02-01 | Discharge: 2021-02-01 | Disposition: A | Discharge status: Home / Self Care | Payer: PRIVATE HEALTH INSURANCE | Source: Ambulatory Visit | Attending: Orthopedic Surgery | Admitting: Orthopedic Surgery

## 2021-02-01 VITALS — BP 151/89 | HR 63 | Temp 98.1°F | Ht 63.0 in | Wt 188.0 lb

## 2021-02-01 DIAGNOSIS — Z01818 Encounter for other preprocedural examination: Secondary | ICD-10-CM | POA: Insufficient documentation

## 2021-02-01 DIAGNOSIS — Z96651 Presence of right artificial knee joint: Secondary | ICD-10-CM | POA: Insufficient documentation

## 2021-02-01 DIAGNOSIS — E119 Type 2 diabetes mellitus without complications: Secondary | ICD-10-CM | POA: Insufficient documentation

## 2021-02-01 HISTORY — DX: Pneumonia, unspecified organism: J18.9

## 2021-02-01 HISTORY — DX: Type 2 diabetes mellitus without complications: E11.9

## 2021-02-01 LAB — CBC
HCT: 38.4 % (ref 36.0–46.0)
Hemoglobin: 12.7 g/dL (ref 12.0–15.0)
MCH: 29 pg (ref 26.0–34.0)
MCHC: 33.1 g/dL (ref 30.0–36.0)
MCV: 87.7 fL (ref 80.0–100.0)
Platelets: 290 10*3/uL (ref 150–400)
RBC: 4.38 MIL/uL (ref 3.87–5.11)
RDW: 12.6 % (ref 11.5–15.5)
WBC: 8.1 10*3/uL (ref 4.0–10.5)
nRBC: 0 % (ref 0.0–0.2)

## 2021-02-01 LAB — COMPREHENSIVE METABOLIC PANEL
ALT: 43 U/L (ref 0–44)
AST: 50 U/L — ABNORMAL HIGH (ref 15–41)
Albumin: 4.2 g/dL (ref 3.5–5.0)
Alkaline Phosphatase: 80 U/L (ref 38–126)
Anion gap: 7 (ref 5–15)
BUN: 8 mg/dL (ref 6–20)
CO2: 28 mmol/L (ref 22–32)
Calcium: 8.9 mg/dL (ref 8.9–10.3)
Chloride: 103 mmol/L (ref 98–111)
Creatinine, Ser: 0.76 mg/dL (ref 0.44–1.00)
GFR, Estimated: >60 mL/min (ref >60)
Glucose, Bld: 120 mg/dL — ABNORMAL HIGH (ref 70–99)
Potassium: 3.8 mmol/L (ref 3.5–5.1)
Sodium: 138 mmol/L (ref 135–145)
Total Bilirubin: 0.8 mg/dL (ref 0.3–1.2)
Total Protein: 7.3 g/dL (ref 6.5–8.1)

## 2021-02-01 LAB — HEMOGLOBIN A1C
Hgb A1c MFr Bld: 6.8 % — ABNORMAL HIGH (ref 4.8–5.6)
Mean Plasma Glucose: 148.46 mg/dL

## 2021-02-01 LAB — TYPE AND SCREEN
ABO/RH(D): A POS
Antibody Screen: NEGATIVE

## 2021-02-01 LAB — GLUCOSE, CAPILLARY: Glucose-Capillary: 116 mg/dL — ABNORMAL HIGH (ref 70–99)

## 2021-02-01 NOTE — Progress Notes (Signed)
COVID Vaccine Completed: NO Date COVID Vaccine completed: COVID vaccine manufacturer: [employer]  COVID Test: 02/10/21 PCP - Altamease Oiler Redmon: PA: clearance: 12/22/20: Chart Cardiologist -   Chest x-ray -  EKG -  Stress Test -  ECHO -  Cardiac Cath -  Pacemaker/ICD device last checked:  Sleep Study -  CPAP -   Fasting Blood Sugar - 120's Checks Blood Sugar ___4__ times a week.  Blood Thinner Instructions: Aspirin Instructions: Last Dose:  Anesthesia review: DIA  Patient denies shortness of breath, fever, cough and chest pain at PAT appointment   Patient verbalized understanding of instructions that were given to them at the PAT appointment. Patient was also instructed that they will need to review over the PAT instructions again at home before surgery.

## 2021-02-09 NOTE — H&P (Signed)
PARTIAL KNEE ADMISSION H&P  Patient is being admitted for right partial knee arthroplasty.  Subjective:  Chief Complaint:right knee pain, medial based osteoarthritis  HPI: Helen Keller, 60 y.o. female, has a history of pain and functional disability in the right knee due to arthritis and has failed non-surgical conservative treatments for greater than 12 weeks to includeNSAID's and/or analgesics and activity modification.  Onset of symptoms was gradual, starting 2 years ago with gradually worsening course since that time. The patient noted no past surgery on the right knee(s).  Patient currently rates pain in the right knee(s) at 7 out of 10 with activity. Patient has worsening of pain with activity and weight bearing, pain that interferes with activities of daily living, and pain with passive range of motion.  Patient has evidence of joint space narrowing by imaging studies. There is no active infection.  Patient Active Problem List   Diagnosis Date Noted   Other allergic rhinitis 03/19/2019   Adverse food reaction 03/19/2019   Gastrointestinal complaints 03/19/2019   Past Medical History:  Diagnosis Date   Anxiety state, unspecified    Asthma    Colitis    Depression    Diabetes mellitus without complication (HCC)    GERD (gastroesophageal reflux disease)    Palpitations    Pneumonia    Portal vein thrombosis 2009   Intrahepatic   Shortness of breath     Past Surgical History:  Procedure Laterality Date   ABDOMINAL HYSTERECTOMY     BREAST EXCISIONAL BIOPSY Left    SHOULDER ARTHROSCOPY Right     No current facility-administered medications for this encounter.   Current Outpatient Medications  Medication Sig Dispense Refill Last Dose   ALPRAZolam (XANAX) 0.25 MG tablet Take 0.25 mg by mouth 2 (two) times daily as needed for anxiety.      losartan (COZAAR) 25 MG tablet Take 25 mg by mouth daily.      metFORMIN (GLUCOPHAGE) 500 MG tablet Take 250 mg by mouth 2 (two) times  daily.      Multiple Vitamin (MULTIVITAMIN WITH MINERALS) TABS tablet Take 1 tablet by mouth daily.      simvastatin (ZOCOR) 20 MG tablet Take 20 mg by mouth every evening.      valACYclovir (VALTREX) 1000 MG tablet Take 1,000 mg by mouth 3 (three) times daily as needed (shingles).      meloxicam (MOBIC) 15 MG tablet Take 15 mg by mouth daily.      Allergies  Allergen Reactions   Codeine     itching    Social History   Tobacco Use   Smoking status: Never   Smokeless tobacco: Never  Substance Use Topics   Alcohol use: No    Family History  Problem Relation Age of Onset   Diabetes Mellitus I Father        deceased   Coronary artery disease Father        deceased   Angioedema Father    Eczema Mother    Allergic rhinitis Neg Hx    Asthma Neg Hx    Urticaria Neg Hx      Review of Systems  Constitutional:  Negative for chills and fever.  Respiratory:  Negative for cough and shortness of breath.   Cardiovascular:  Negative for chest pain.  Gastrointestinal:  Negative for nausea and vomiting.  Musculoskeletal:  Positive for arthralgias.    Objective:  Physical Exam Well nourished and well developed. General: Alert and oriented x3, cooperative and pleasant,  no acute distress. Head: normocephalic, atraumatic, neck supple. Eyes: EOMI.  Musculoskeletal: Right knee exam: No palpable effusion, warmth or erythema Slight flexion contracture likely related due to pain Flexion close to 120 degrees with pain and tightness Predominant tenderness is medially Stable and intact medial and lateral collateral ligaments  Calves soft and nontender. Motor function intact in LE. Strength 5/5 LE bilaterally. Neuro: Distal pulses 2+. Sensation to light touch intact in LE.  Vital signs in last 24 hours:    Labs:   Estimated body mass index is 33.3 kg/m as calculated from the following:   Height as of 02/01/21: 5\' 3"  (1.6 m).   Weight as of 02/01/21: 85.3 kg.   Imaging  Review Plain radiographs demonstrate severe degenerative joint disease of the medial compartment of the right knee(s). The overall alignment isneutral. The bone quality appears to be adequate for age and reported activity level.      Assessment/Plan:  End stage medial compartment arthritis, right knee   The patient history, physical examination, clinical judgment of the provider and imaging studies are consistent with end stage degenerative joint disease of the right knee(s) and partial knee arthroplasty is deemed medically necessary. The treatment options including medical management, injection therapy arthroscopy and arthroplasty were discussed at length. The risks and benefits of partial knee arthroplasty were presented and reviewed. The risks due to aseptic loosening, infection, stiffness, patella tracking problems, thromboembolic complications and other imponderables were discussed. The patient acknowledged the explanation, agreed to proceed with the plan and consent was signed. Patient is being admitted for inpatient treatment for surgery, pain control, PT, OT, prophylactic antibiotics, VTE prophylaxis, progressive ambulation and ADL's and discharge planning. The patient is planning to be discharged  home.   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

## 2021-02-10 ENCOUNTER — Other Ambulatory Visit: Payer: Self-pay | Admitting: Orthopedic Surgery

## 2021-02-10 LAB — SARS CORONAVIRUS 2 (TAT 6-24 HRS): SARS Coronavirus 2: NEGATIVE

## 2021-02-13 NOTE — Anesthesia Preprocedure Evaluation (Addendum)
Anesthesia Evaluation  Patient identified by MRN, date of birth, ID band Patient awake    Reviewed: Allergy & Precautions, NPO status , Patient's Chart, lab work & pertinent test results  History of Anesthesia Complications Negative for: history of anesthetic complications  Airway Mallampati: II  TM Distance: >3 FB Neck ROM: Full    Dental  (+) Dental Advisory Given, Partial Lower, Partial Upper   Pulmonary asthma ,    Pulmonary exam normal        Cardiovascular hypertension, Pt. on medications Normal cardiovascular exam     Neuro/Psych PSYCHIATRIC DISORDERS Anxiety Depression negative neurological ROS     GI/Hepatic Neg liver ROS, GERD  Controlled,  Endo/Other  diabetes, Type 2, Oral Hypoglycemic Agents Obesity   Renal/GU negative Renal ROS     Musculoskeletal negative musculoskeletal ROS (+)   Abdominal   Peds  Hematology negative hematology ROS (+)   Anesthesia Other Findings   Reproductive/Obstetrics                            Anesthesia Physical Anesthesia Plan  ASA: 2  Anesthesia Plan: Spinal   Post-op Pain Management: Regional block, Tylenol PO (pre-op) and Celebrex PO (pre-op)   Induction:   PONV Risk Score and Plan: 2 and Treatment may vary due to age or medical condition and Propofol infusion  Airway Management Planned: Natural Airway and Simple Face Mask  Additional Equipment: None  Intra-op Plan:   Post-operative Plan:   Informed Consent: I have reviewed the patients History and Physical, chart, labs and discussed the procedure including the risks, benefits and alternatives for the proposed anesthesia with the patient or authorized representative who has indicated his/her understanding and acceptance.       Plan Discussed with: CRNA and Anesthesiologist  Anesthesia Plan Comments: (Labs reviewed, platelets acceptable. Discussed risks and benefits of spinal,  including spinal/epidural hematoma, infection, failed block, and PDPH. Patient expressed understanding and wished to proceed. )       Anesthesia Quick Evaluation

## 2021-02-14 ENCOUNTER — Other Ambulatory Visit: Payer: Self-pay

## 2021-02-14 ENCOUNTER — Encounter (HOSPITAL_COMMUNITY)
Admission: RE | Disposition: A | Discharge status: Home / Self Care | Payer: Self-pay | Source: Home / Self Care | Attending: Orthopedic Surgery

## 2021-02-14 ENCOUNTER — Observation Stay (HOSPITAL_COMMUNITY)
Admission: RE | Admit: 2021-02-14 | Discharge: 2021-02-15 | Disposition: A | Discharge status: Home / Self Care | Payer: PRIVATE HEALTH INSURANCE | Attending: Orthopedic Surgery | Admitting: Orthopedic Surgery

## 2021-02-14 ENCOUNTER — Ambulatory Visit (HOSPITAL_COMMUNITY): Payer: PRIVATE HEALTH INSURANCE | Admitting: Anesthesiology

## 2021-02-14 ENCOUNTER — Encounter (HOSPITAL_COMMUNITY): Payer: Self-pay | Admitting: Orthopedic Surgery

## 2021-02-14 DIAGNOSIS — E119 Type 2 diabetes mellitus without complications: Secondary | ICD-10-CM | POA: Diagnosis not present

## 2021-02-14 DIAGNOSIS — Z79899 Other long term (current) drug therapy: Secondary | ICD-10-CM | POA: Insufficient documentation

## 2021-02-14 DIAGNOSIS — M1711 Unilateral primary osteoarthritis, right knee: Principal | ICD-10-CM | POA: Insufficient documentation

## 2021-02-14 DIAGNOSIS — Z01818 Encounter for other preprocedural examination: Secondary | ICD-10-CM

## 2021-02-14 DIAGNOSIS — Z96651 Presence of right artificial knee joint: Secondary | ICD-10-CM

## 2021-02-14 HISTORY — PX: PARTIAL KNEE ARTHROPLASTY: SHX2174

## 2021-02-14 LAB — GLUCOSE, CAPILLARY
Glucose-Capillary: 152 mg/dL — ABNORMAL HIGH (ref 70–99)
Glucose-Capillary: 129 mg/dL — ABNORMAL HIGH (ref 70–99)
Glucose-Capillary: 272 mg/dL — ABNORMAL HIGH (ref 70–99)
Glucose-Capillary: 264 mg/dL — ABNORMAL HIGH (ref 70–99)

## 2021-02-14 SURGERY — ARTHROPLASTY, KNEE, UNICOMPARTMENTAL
Anesthesia: Spinal | Site: Knee | Laterality: Right

## 2021-02-14 MED ORDER — PROPOFOL 1000 MG/100ML IV EMUL
INTRAVENOUS | Status: AC
  Filled 2021-02-14: qty 100

## 2021-02-14 MED ORDER — LOSARTAN POTASSIUM 25 MG PO TABS
25.0000 mg | ORAL_TABLET | Freq: Every day | ORAL | Status: DC
  Administered 2021-02-15: 08:00:00 25 mg via ORAL
  Filled 2021-02-14: qty 1

## 2021-02-14 MED ORDER — KETOROLAC TROMETHAMINE 30 MG/ML IJ SOLN
INTRAMUSCULAR | Status: AC
  Filled 2021-02-14: qty 1

## 2021-02-14 MED ORDER — METOCLOPRAMIDE HCL 5 MG PO TABS
5.0000 mg | ORAL_TABLET | Freq: Three times a day (TID) | ORAL | Status: DC | PRN
  Filled 2021-02-14: qty 2

## 2021-02-14 MED ORDER — MENTHOL 3 MG MT LOZG: 1.0000 | LOZENGE | OROMUCOSAL | Status: DC | PRN

## 2021-02-14 MED ORDER — ASPIRIN 81 MG PO CHEW
81.0000 mg | CHEWABLE_TABLET | Freq: Two times a day (BID) | ORAL | Status: DC
  Administered 2021-02-14 – 2021-02-15 (×2): 81 mg via ORAL
  Filled 2021-02-14 (×2): qty 1

## 2021-02-14 MED ORDER — ACETAMINOPHEN 500 MG PO TABS
1000.0000 mg | ORAL_TABLET | Freq: Once | ORAL | Status: AC
  Administered 2021-02-14: 06:00:00 1000 mg via ORAL
  Filled 2021-02-14: qty 2

## 2021-02-14 MED ORDER — FENTANYL CITRATE PF 50 MCG/ML IJ SOSY: 25.0000 ug | PREFILLED_SYRINGE | INTRAMUSCULAR | Status: DC | PRN

## 2021-02-14 MED ORDER — PHENOL 1.4 % MT LIQD: 1.0000 | OROMUCOSAL | Status: DC | PRN

## 2021-02-14 MED ORDER — HYDROMORPHONE HCL 1 MG/ML IJ SOLN
0.5000 mg | INTRAMUSCULAR | Status: DC | PRN
  Administered 2021-02-15: 10:00:00 1 mg via INTRAVENOUS
  Filled 2021-02-14: qty 1

## 2021-02-14 MED ORDER — ONDANSETRON HCL 4 MG/2ML IJ SOLN
INTRAMUSCULAR | Status: DC | PRN
  Administered 2021-02-14: 4 mg via INTRAVENOUS

## 2021-02-14 MED ORDER — SODIUM CHLORIDE (PF) 0.9 % IJ SOLN
INTRAMUSCULAR | Status: DC | PRN
  Administered 2021-02-14: 30 mL

## 2021-02-14 MED ORDER — PROPOFOL 10 MG/ML IV BOLUS
INTRAVENOUS | Status: DC | PRN
  Administered 2021-02-14: 30 mg via INTRAVENOUS

## 2021-02-14 MED ORDER — DEXAMETHASONE SODIUM PHOSPHATE 10 MG/ML IJ SOLN: 8.0000 mg | Freq: Once | INTRAMUSCULAR | Status: DC

## 2021-02-14 MED ORDER — FENTANYL CITRATE (PF) 100 MCG/2ML IJ SOLN
INTRAMUSCULAR | Status: DC | PRN
  Administered 2021-02-14: 100 ug via INTRAVENOUS

## 2021-02-14 MED ORDER — ONDANSETRON HCL 4 MG/2ML IJ SOLN
INTRAMUSCULAR | Status: AC
  Filled 2021-02-14: qty 2

## 2021-02-14 MED ORDER — FENTANYL CITRATE (PF) 100 MCG/2ML IJ SOLN
INTRAMUSCULAR | Status: AC
  Filled 2021-02-14: qty 2

## 2021-02-14 MED ORDER — FERROUS SULFATE 325 (65 FE) MG PO TABS
325.0000 mg | ORAL_TABLET | Freq: Three times a day (TID) | ORAL | Status: DC
  Administered 2021-02-14 – 2021-02-15 (×3): 325 mg via ORAL
  Filled 2021-02-14 (×3): qty 1

## 2021-02-14 MED ORDER — SIMVASTATIN 20 MG PO TABS
20.0000 mg | ORAL_TABLET | Freq: Every evening | ORAL | Status: DC
  Administered 2021-02-14: 18:00:00 20 mg via ORAL
  Filled 2021-02-14: qty 1

## 2021-02-14 MED ORDER — OXYCODONE HCL 5 MG PO TABS
5.0000 mg | ORAL_TABLET | ORAL | Status: DC | PRN
  Administered 2021-02-14 – 2021-02-15 (×4): 10 mg via ORAL
  Filled 2021-02-14 (×4): qty 2

## 2021-02-14 MED ORDER — CEFAZOLIN SODIUM-DEXTROSE 2-4 GM/100ML-% IV SOLN
2.0000 g | Freq: Four times a day (QID) | INTRAVENOUS | Status: AC
  Administered 2021-02-14 (×2): 2 g via INTRAVENOUS
  Filled 2021-02-14: qty 100

## 2021-02-14 MED ORDER — ONDANSETRON HCL 4 MG PO TABS
4.0000 mg | ORAL_TABLET | Freq: Four times a day (QID) | ORAL | Status: DC | PRN
  Filled 2021-02-14: qty 1

## 2021-02-14 MED ORDER — ONDANSETRON HCL 4 MG/2ML IJ SOLN: 4.0000 mg | Freq: Four times a day (QID) | INTRAMUSCULAR | Status: DC | PRN

## 2021-02-14 MED ORDER — ACETAMINOPHEN 325 MG PO TABS: 325.0000 mg | ORAL_TABLET | Freq: Four times a day (QID) | ORAL | Status: DC | PRN

## 2021-02-14 MED ORDER — OXYCODONE HCL 5 MG PO TABS
10.0000 mg | ORAL_TABLET | ORAL | Status: DC | PRN
  Administered 2021-02-15: 12:00:00 15 mg via ORAL
  Filled 2021-02-14: qty 3

## 2021-02-14 MED ORDER — BUPIVACAINE-EPINEPHRINE (PF) 0.25% -1:200000 IJ SOLN
INTRAMUSCULAR | Status: AC
  Filled 2021-02-14: qty 30

## 2021-02-14 MED ORDER — OXYCODONE HCL 5 MG/5ML PO SOLN: 5.0000 mg | Freq: Once | ORAL | Status: DC | PRN

## 2021-02-14 MED ORDER — METOCLOPRAMIDE HCL 5 MG/ML IJ SOLN: 5.0000 mg | Freq: Three times a day (TID) | INTRAMUSCULAR | Status: DC | PRN

## 2021-02-14 MED ORDER — STERILE WATER FOR IRRIGATION IR SOLN
Status: DC | PRN
  Administered 2021-02-14: 2000 mL

## 2021-02-14 MED ORDER — METHOCARBAMOL 500 MG IVPB - SIMPLE MED
500.0000 mg | Freq: Four times a day (QID) | INTRAVENOUS | Status: DC | PRN
  Administered 2021-02-14: 15:00:00 500 mg via INTRAVENOUS
  Filled 2021-02-14: qty 50
  Filled 2021-02-14: qty 500

## 2021-02-14 MED ORDER — POLYETHYLENE GLYCOL 3350 17 G PO PACK: 17.0000 g | PACK | Freq: Every day | ORAL | Status: DC | PRN

## 2021-02-14 MED ORDER — CHLORHEXIDINE GLUCONATE 0.12 % MT SOLN
15.0000 mL | Freq: Once | OROMUCOSAL | Status: AC
  Administered 2021-02-14: 06:00:00 15 mL via OROMUCOSAL

## 2021-02-14 MED ORDER — DEXAMETHASONE SODIUM PHOSPHATE 10 MG/ML IJ SOLN
INTRAMUSCULAR | Status: AC
  Filled 2021-02-14: qty 1

## 2021-02-14 MED ORDER — LACTATED RINGERS IV SOLN: INTRAVENOUS | Status: DC

## 2021-02-14 MED ORDER — PROMETHAZINE HCL 25 MG/ML IJ SOLN: 6.2500 mg | INTRAMUSCULAR | Status: DC | PRN

## 2021-02-14 MED ORDER — PROPOFOL 500 MG/50ML IV EMUL
INTRAVENOUS | Status: DC | PRN
  Administered 2021-02-14: 50 ug/kg/min via INTRAVENOUS

## 2021-02-14 MED ORDER — ALPRAZOLAM 0.25 MG PO TABS: 0.2500 mg | ORAL_TABLET | Freq: Two times a day (BID) | ORAL | Status: DC | PRN

## 2021-02-14 MED ORDER — ORAL CARE MOUTH RINSE: 15.0000 mL | Freq: Once | OROMUCOSAL | Status: AC

## 2021-02-14 MED ORDER — 0.9 % SODIUM CHLORIDE (POUR BTL) OPTIME
TOPICAL | Status: DC | PRN
  Administered 2021-02-14: 08:00:00 1000 mL

## 2021-02-14 MED ORDER — DIPHENHYDRAMINE HCL 12.5 MG/5ML PO ELIX: 12.5000 mg | ORAL_SOLUTION | ORAL | Status: DC | PRN

## 2021-02-14 MED ORDER — ROPIVACAINE HCL 5 MG/ML IJ SOLN
INTRAMUSCULAR | Status: DC | PRN
  Administered 2021-02-14: 30 mL via PERINEURAL

## 2021-02-14 MED ORDER — KETOROLAC TROMETHAMINE 15 MG/ML IJ SOLN
7.5000 mg | Freq: Four times a day (QID) | INTRAMUSCULAR | Status: AC
  Administered 2021-02-14 (×2): 7.5 mg via INTRAVENOUS
  Filled 2021-02-14 (×2): qty 1

## 2021-02-14 MED ORDER — METFORMIN HCL 500 MG PO TABS
250.0000 mg | ORAL_TABLET | Freq: Two times a day (BID) | ORAL | Status: DC
  Administered 2021-02-14 – 2021-02-15 (×2): 250 mg via ORAL
  Filled 2021-02-14 (×2): qty 1

## 2021-02-14 MED ORDER — ROPIVACAINE HCL 5 MG/ML IJ SOLN: INTRAMUSCULAR | Status: DC | PRN

## 2021-02-14 MED ORDER — DOCUSATE SODIUM 100 MG PO CAPS
100.0000 mg | ORAL_CAPSULE | Freq: Two times a day (BID) | ORAL | Status: DC
  Administered 2021-02-14 – 2021-02-15 (×2): 100 mg via ORAL
  Filled 2021-02-14 (×2): qty 1

## 2021-02-14 MED ORDER — POVIDONE-IODINE 10 % EX SWAB
2.0000 "application " | Freq: Once | CUTANEOUS | Status: AC
  Administered 2021-02-14: 2 via TOPICAL

## 2021-02-14 MED ORDER — SODIUM CHLORIDE (PF) 0.9 % IJ SOLN
INTRAMUSCULAR | Status: AC
  Filled 2021-02-14: qty 30

## 2021-02-14 MED ORDER — OXYCODONE HCL 5 MG PO TABS: 5.0000 mg | ORAL_TABLET | Freq: Once | ORAL | Status: DC | PRN

## 2021-02-14 MED ORDER — SODIUM CHLORIDE 0.9 % IV SOLN: INTRAVENOUS | Status: DC

## 2021-02-14 MED ORDER — BUPIVACAINE IN DEXTROSE 0.75-8.25 % IT SOLN
INTRATHECAL | Status: DC | PRN
  Administered 2021-02-14: 1.6 mL via INTRATHECAL

## 2021-02-14 MED ORDER — TRANEXAMIC ACID-NACL 1000-0.7 MG/100ML-% IV SOLN
1000.0000 mg | Freq: Once | INTRAVENOUS | Status: AC
  Administered 2021-02-14: 12:00:00 1000 mg via INTRAVENOUS
  Filled 2021-02-14: qty 100

## 2021-02-14 MED ORDER — DEXAMETHASONE SODIUM PHOSPHATE 10 MG/ML IJ SOLN
INTRAMUSCULAR | Status: DC | PRN
  Administered 2021-02-14: 8 mg via INTRAVENOUS

## 2021-02-14 MED ORDER — BISACODYL 10 MG RE SUPP: 10.0000 mg | Freq: Every day | RECTAL | Status: DC | PRN

## 2021-02-14 MED ORDER — KETOROLAC TROMETHAMINE 30 MG/ML IJ SOLN
INTRAMUSCULAR | Status: DC | PRN
  Administered 2021-02-14: 30 mg

## 2021-02-14 MED ORDER — BUPIVACAINE-EPINEPHRINE 0.25% -1:200000 IJ SOLN
INTRAMUSCULAR | Status: DC | PRN
  Administered 2021-02-14: 30 mL

## 2021-02-14 MED ORDER — MELOXICAM 15 MG PO TABS
15.0000 mg | ORAL_TABLET | Freq: Every day | ORAL | Status: DC
  Administered 2021-02-15: 08:00:00 15 mg via ORAL
  Filled 2021-02-14: qty 1

## 2021-02-14 MED ORDER — TRANEXAMIC ACID-NACL 1000-0.7 MG/100ML-% IV SOLN
1000.0000 mg | INTRAVENOUS | Status: AC
  Administered 2021-02-14: 08:00:00 1000 mg via INTRAVENOUS
  Filled 2021-02-14: qty 100

## 2021-02-14 MED ORDER — MIDAZOLAM HCL 2 MG/2ML IJ SOLN
INTRAMUSCULAR | Status: AC
  Filled 2021-02-14: qty 2

## 2021-02-14 MED ORDER — INSULIN ASPART 100 UNIT/ML IJ SOLN
0.0000 [IU] | Freq: Three times a day (TID) | INTRAMUSCULAR | Status: DC
  Administered 2021-02-14: 18:00:00 8 [IU] via SUBCUTANEOUS
  Administered 2021-02-15: 08:00:00 3 [IU] via SUBCUTANEOUS
  Administered 2021-02-15: 12:00:00 5 [IU] via SUBCUTANEOUS

## 2021-02-14 MED ORDER — DEXAMETHASONE SODIUM PHOSPHATE 10 MG/ML IJ SOLN
10.0000 mg | Freq: Once | INTRAMUSCULAR | Status: AC
  Administered 2021-02-15: 08:00:00 10 mg via INTRAVENOUS
  Filled 2021-02-14: qty 1

## 2021-02-14 MED ORDER — METHOCARBAMOL 500 MG PO TABS
500.0000 mg | ORAL_TABLET | Freq: Four times a day (QID) | ORAL | Status: DC | PRN
  Administered 2021-02-15: 08:00:00 500 mg via ORAL
  Filled 2021-02-14: qty 1

## 2021-02-14 MED ORDER — PROPOFOL 500 MG/50ML IV EMUL
INTRAVENOUS | Status: AC
  Filled 2021-02-14: qty 50

## 2021-02-14 MED ORDER — CELECOXIB 200 MG PO CAPS
200.0000 mg | ORAL_CAPSULE | Freq: Once | ORAL | Status: AC
  Administered 2021-02-14: 06:00:00 200 mg via ORAL
  Filled 2021-02-14: qty 1

## 2021-02-14 MED ORDER — CEFAZOLIN SODIUM-DEXTROSE 2-4 GM/100ML-% IV SOLN
2.0000 g | INTRAVENOUS | Status: AC
  Administered 2021-02-14: 08:00:00 2 g via INTRAVENOUS
  Filled 2021-02-14: qty 100

## 2021-02-14 MED ORDER — MIDAZOLAM HCL 2 MG/2ML IJ SOLN
INTRAMUSCULAR | Status: DC | PRN
  Administered 2021-02-14: 2 mg via INTRAVENOUS

## 2021-02-14 SURGICAL SUPPLY — 48 items
BAG COUNTER SPONGE SURGICOUNT (BAG) ×1 IMPLANT
BAG SURGICOUNT SPONGE COUNTING (BAG) ×1
BAG ZIPLOCK 12X15 (MISCELLANEOUS) IMPLANT
BLADE SAW RECIPROCATING 77.5 (BLADE) ×3 IMPLANT
BLADE SAW SGTL 13.0X1.19X90.0M (BLADE) ×3 IMPLANT
BNDG ELASTIC 6X5.8 VLCR STR LF (GAUZE/BANDAGES/DRESSINGS) ×3 IMPLANT
BOWL SMART MIX CTS (DISPOSABLE) ×3 IMPLANT
CEMENT BONE R 1X40 (Cement) ×3 IMPLANT
COVER SURGICAL LIGHT HANDLE (MISCELLANEOUS) ×3 IMPLANT
CUFF TOURN SGL QUICK 34 (TOURNIQUET CUFF) ×2
CUFF TRNQT CYL 34X4.125X (TOURNIQUET CUFF) ×1 IMPLANT
DECANTER SPIKE VIAL GLASS SM (MISCELLANEOUS) ×10 IMPLANT
DERMABOND ADVANCED (GAUZE/BANDAGES/DRESSINGS) ×2
DERMABOND ADVANCED .7 DNX12 (GAUZE/BANDAGES/DRESSINGS) ×1 IMPLANT
DRAPE U-SHAPE 47X51 STRL (DRAPES) ×3 IMPLANT
DRESSING AQUACEL AG SP 3.5X10 (GAUZE/BANDAGES/DRESSINGS) ×1 IMPLANT
DRSG AQUACEL AG SP 3.5X10 (GAUZE/BANDAGES/DRESSINGS) ×3
DURAPREP 26ML APPLICATOR (WOUND CARE) ×3 IMPLANT
ELECT REM PT RETURN 15FT ADLT (MISCELLANEOUS) ×3 IMPLANT
GLOVE SURG ENC MOIS LTX SZ6 (GLOVE) ×3 IMPLANT
GLOVE SURG ENC MOIS LTX SZ7 (GLOVE) ×3 IMPLANT
GLOVE SURG UNDER LTX SZ6.5 (GLOVE) ×3 IMPLANT
GLOVE SURG UNDER POLY LF SZ7.5 (GLOVE) ×9 IMPLANT
GOWN STRL REUS W/TWL LRG LVL3 (GOWN DISPOSABLE) ×3 IMPLANT
HDLS TROCR DRIL PIN KNEE 75 (PIN) ×2
HOLDER FOLEY CATH W/STRAP (MISCELLANEOUS) ×2 IMPLANT
INSERT TIB BEAR CMT PS RM F (Insert) ×2 IMPLANT
INSERT TIB BEAR PS F 10 (Insert) ×2 IMPLANT
INSERTER TIP PARTIAL KNEE (MISCELLANEOUS) ×2 IMPLANT
KIT TURNOVER KIT A (KITS) ×2 IMPLANT
MANIFOLD NEPTUNE II (INSTRUMENTS) ×3 IMPLANT
NDL SAFETY ECLIPSE 18X1.5 (NEEDLE) ×1 IMPLANT
NEEDLE HYPO 18GX1.5 SHARP (NEEDLE) ×2
PACK TOTAL KNEE CUSTOM (KITS) ×3 IMPLANT
PIN DRILL HDLS TROCAR 75 4PK (PIN) IMPLANT
SCREW HEADED 33MM KNEE (MISCELLANEOUS) ×4 IMPLANT
SCREW HEADED 48MM KNEE (MISCELLANEOUS) ×2 IMPLANT
SET PAD KNEE POSITIONER (MISCELLANEOUS) ×3 IMPLANT
SPONGE T-LAP 18X18 ~~LOC~~+RFID (SPONGE) ×4 IMPLANT
SUT MNCRL AB 4-0 PS2 18 (SUTURE) ×3 IMPLANT
SUT STRATAFIX PDS+ 0 24IN (SUTURE) ×3 IMPLANT
SUT VIC AB 1 CT1 36 (SUTURE) ×3 IMPLANT
SUT VIC AB 2-0 CT1 27 (SUTURE) ×4
SUT VIC AB 2-0 CT1 TAPERPNT 27 (SUTURE) ×2 IMPLANT
SYR 3ML LL SCALE MARK (SYRINGE) ×3 IMPLANT
SYSTEM KNEE CMT PS 1 RT (Joint) ×2 IMPLANT
TRAY FOLEY MTR SLVR 14FR STAT (SET/KITS/TRAYS/PACK) ×2 IMPLANT
WRAP KNEE MAXI GEL POST OP (GAUZE/BANDAGES/DRESSINGS) ×3 IMPLANT

## 2021-02-14 NOTE — Brief Op Note (Signed)
02/14/2021  8:50 AM  PATIENT:  Helen Keller  60 y.o. female  PRE-OPERATIVE DIAGNOSIS:  Right knee medial compartmental osteoarthritis  POST-OPERATIVE DIAGNOSIS:  Right knee medial compartmental osteoarthritis  PROCEDURE:  Procedure(s): UNICOMPARTMENTAL KNEE MEDIALLY (Right)  SURGEON:  Surgeon(s) and Role:    Durene Romans, MD - Primary  PHYSICIAN ASSISTANT: Rosalene Billings, PA-C  ANESTHESIA:   regional and spinal  EBL:  <100cc  BLOOD ADMINISTERED:none  DRAINS: none   LOCAL MEDICATIONS USED:  MARCAINE     SPECIMEN:  No Specimen  DISPOSITION OF SPECIMEN:  N/A  COUNTS:  YES  TOURNIQUET:   Total Tourniquet Time Documented: Thigh (Right) - 28 minutes Total: Thigh (Right) - 28 minutes   DICTATION: .Other Dictation: Dictation Number [id number]  PLAN OF CARE: Admit for overnight observation  PATIENT DISPOSITION:  PACU - hemodynamically stable.   Delay start of Pharmacological VTE agent (>24hrs) due to surgical blood loss or risk of bleeding: no

## 2021-02-14 NOTE — Op Note (Signed)
NAME: Helen Keller, Helen Keller MEDICAL RECORD NO: [id number] ACCOUNT NO: [id number] DATE OF BIRTH: [birth year] FACILITY: [facility] LOCATION: [facility] PHYSICIAN: Madlyn Frankel. Charlann Boxer, MD  Operative Report   DATE OF PROCEDURE: 02/14/2021  PREOPERATIVE DIAGNOSIS:  Right knee medial compartment osteoarthritis.  POSTOPERATIVE DIAGNOSIS:  Right knee medial compartment osteoarthritis.  PROCEDURE:  Right knee partial medial arthroplasty.  COMPONENTS USED:  Zimmer Persona partial knee system with size 1 right femur, size right medial F tibial baseplate with a 10 mm insert.  SURGEON:  Madlyn Frankel. Charlann Boxer, MD  ASSISTANT:  Rosalene Billings, PA-C.  Note that Ms. Helen Keller was present for the entirety of the case for preoperative positioning, perioperative management of the operative extremity, general facilitation of the case, and primary wound closure.  ANESTHESIA:  Regional plus spinal.  BLOOD LOSS:  Less than 100 mL.  DRAINS:  None.  COMPLICATIONS:  None.  INDICATIONS FOR PROCEDURE:  The patient is a 60 year old female, seen and evaluated for right knee pain.  The predominance of her knee pain was based medially.  Radiographs revealed an anterior medial pattern of arthritis.  She had tried and failed  conservative measures for greater than 6 months with significant reduction in her quality of life and functional ability.  We discussed treatment options at this point in terms of arthroplasty.  I felt based on her age and radiographic pattern of her  arthritis that she was a candidate for partial knee arthroplasty.  We reviewed the pros and cons and risks and benefits of a partial knee arthroplasty.  Specific risk of infection, DVT, component failure, stiffness, the need for future surgery based on  progression of osteoarthritis were reviewed.  Consent was obtained for benefit of pain relief.  DESCRIPTION OF PROCEDURE:  The patient was brought to the operative theater.  Once adequate anesthesia, preoperative antibiotics,  Ancef, was administered as well as tranexamic acid and Decadron, she was positioned supine with a right thigh tourniquet  placed.  The right lower extremity was prepped and draped in sterile fashion using the [facility] leg holder.  A timeout was performed identifying the patient, planned procedure, and extremity.  The leg was exsanguinated and tourniquet elevated to 225 mmHg.   A paramidline incision was made followed by soft tissue exposure.  I then made a partial median arthrotomy.  Following initial exposure, we used the extramedullary guide and made a resection off the proximal medial tibia at 4 mm based off the stylus.   Following this resection, the distal cutting block of the femur was positioned and pinned into place.  The distal femoral cut was made.  The knee was then flexed.  The cut surface of the femur seemed to be best fit with size 1 femur.  The 1 femur was  pinned into place, and drill holes and chamfer cuts made.  The cut surface of the tibia seemed to be best fit with a size F.  The F tibial tray was pinned into place, and trial reduction was carried out.  With this, I found that the 10 mm insert was  balanced in extension and flexion.  Drill holes were made into the proximal tibia through the tibial tray.  All the trial components were now removed.  I did create further holes in sclerotic bone on the tibia.  I injected the synovial capsule junction  with 0.25% Marcaine with epinephrine, 1 mL of Toradol, and saline.  We irrigated the knee.  Final components were opened, and cement was mixed.  The  final components were cemented on the clean and dried cut surface of the bone, and the knee was brought  to about 30 degrees of extension with the 2 mm spacer guide in place for compression.  Extruded cement was removed.  Tourniquet was let down after 28 minutes without significant hemostasis required.  Once the cement had fully cured, excessive cement was  removed throughout the knee as best could  be visualized.  Based on the trial reduction, the size 10 mm insert was selected and then snapped into place.  We re-irrigated the knee at the end of the case.  The extensor mechanism was then reapproximated using #1 Vicryl and #1 Stratafix suture.  The remainder of the wound was closed in layers with 2-0 Vicryl and a running Monocryl stitch.  The knee was cleaned, dried, and dressed sterilely using surgical  glue and Aquacel dressing.  She was then brought to the recovery room in stable condition, tolerating the procedure well.  Procedure reviewed with the family.  We plan on overnight observation with home discharge tomorrow.   [facility] D: 02/14/2021 8:57:42 am T: 02/14/2021 9:30:00 am  JOB: [id number]/ [id number]

## 2021-02-14 NOTE — Transfer of Care (Signed)
Immediate Anesthesia Transfer of Care Note  Patient: Helen Keller  Procedure(s) Performed: UNICOMPARTMENTAL KNEE MEDIALLY (Right: Knee)  Patient Location: PACU  Anesthesia Type:Spinal and MAC combined with regional for post-op pain  Level of Consciousness: awake, alert  and oriented  Airway & Oxygen Therapy: Patient Spontanous Breathing and Patient connected to face mask oxygen  Post-op Assessment: Report given to RN and Post -op Vital signs reviewed and stable  Post vital signs: Reviewed and stable  Last Vitals:  Vitals Value Taken Time  BP 111/66 02/14/21 0912  Temp    Pulse 78 02/14/21 0913  Resp 16 02/14/21 0913  SpO2 100 % 02/14/21 0913  Vitals shown include unvalidated device data.  Last Pain:  Vitals:   02/14/21 0609  TempSrc: Oral  PainSc:       Patients Stated Pain Goal: 4 (33/61/22 4497)  Complications: No notable events documented.

## 2021-02-14 NOTE — Anesthesia Postprocedure Evaluation (Signed)
Anesthesia Post Note  Patient: Helen Keller  Procedure(s) Performed: UNICOMPARTMENTAL KNEE MEDIALLY (Right: Knee)     Patient location during evaluation: PACU Anesthesia Type: Spinal Level of consciousness: awake and alert Pain management: pain level controlled Vital Signs Assessment: post-procedure vital signs reviewed and stable Respiratory status: spontaneous breathing and respiratory function stable Cardiovascular status: blood pressure returned to baseline and stable Postop Assessment: spinal receding and no apparent nausea or vomiting Anesthetic complications: no   No notable events documented.  Last Vitals:  Vitals:   02/14/21 1000 02/14/21 1021  BP: 111/66 120/70  Pulse: 67 63  Resp: 12   Temp:  (!) 36.4 C  SpO2: 98% 97%    Last Pain:  Vitals:   02/14/21 1021  TempSrc: Oral  PainSc:                  Beryle Lathe

## 2021-02-14 NOTE — Evaluation (Signed)
Physical Therapy Evaluation Patient Details Name: Helen Keller MRN: [id number] DOB: Oct 09, 1960 Today's Date: 02/14/2021  History of Present Illness  60 yo  female, S/P right uni knee 02/14/21  Clinical Impression  The patient reports pain  minimal. Patient ambulated x 100' with RW. Plans Dc home with family. Pt admitted with above diagnosis.   Pt currently with functional limitations due to the deficits listed below (see PT Problem List). Pt will benefit from skilled PT to increase their independence and safety with mobility to allow discharge to the venue listed below.          Recommendations for follow up therapy are one component of a multi-disciplinary discharge planning process, led by the attending physician.  Recommendations may be updated based on patient status, additional functional criteria and insurance authorization.  Follow Up Recommendations Follow physician's recommendations for discharge plan and follow up therapies    Assistance Recommended at Discharge Set up Supervision/Assistance  Functional Status Assessment Patient has had a recent decline in their functional status and demonstrates the ability to make significant improvements in function in a reasonable and predictable amount of time.  Equipment Recommendations  Rolling walker (2 wheels)    Recommendations for Other Services       Precautions / Restrictions Precautions Precautions: Fall;Knee      Mobility  Bed Mobility Overal bed mobility: Needs Assistance Bed Mobility: Supine to Sit     Supine to sit: Supervision          Transfers Overall transfer level: Needs assistance   Transfers: Sit to/from Stand Sit to Stand: Min guard           General transfer comment: cues for hand placement    Ambulation/Gait Ambulation/Gait assistance: Min guard Gait Distance (Feet): 100 Feet Assistive device: Rolling walker (2 wheels) Gait Pattern/deviations: Step-to pattern;Step-through pattern        General Gait Details: gait smoothe  Stairs            Wheelchair Mobility    Modified Rankin (Stroke Patients Only)       Balance Overall balance assessment: Mild deficits observed, not formally tested                                           Pertinent Vitals/Pain Pain Assessment: 0-10 Pain Score: 2  Pain Descriptors / Indicators: Heaviness Pain Intervention(s): Monitored during session;Premedicated before session;Ice applied    Home Living Family/patient expects to be discharged to:: Private residence Living Arrangements: Spouse/significant other;Children Available Help at Discharge: Family;Available PRN/intermittently Type of Home: House Home Access: Stairs to enter Entrance Stairs-Rails: None Entrance Stairs-Number of Steps: 3   Home Layout: One level Home Equipment: Cane - quad;Toilet riser;Electric scooter      Prior Function Prior Level of Function : Independent/Modified Independent             Mobility Comments: used scooter some       Hand Dominance   Dominant Hand: Right    Extremity/Trunk Assessment   Upper Extremity Assessment Upper Extremity Assessment: Overall WFL for tasks assessed    Lower Extremity Assessment Lower Extremity Assessment: RLE deficits/detail RLE Deficits / Details: 0-60 knee flex    Cervical / Trunk Assessment Cervical / Trunk Assessment: Normal  Communication   Communication: No difficulties  Cognition Arousal/Alertness: Awake/alert Behavior During Therapy: WFL for tasks assessed/performed Overall Cognitive Status: Within Functional Limits  for tasks assessed                                          General Comments      Exercises Total Joint Exercises Ankle Circles/Pumps: AROM;Both;10 reps Quad Sets: AROM;Both;10 reps   Assessment/Plan    PT Assessment Patient needs continued PT services  PT Problem List Decreased strength;Decreased mobility;Decreased range  of motion;Decreased knowledge of precautions;Pain;Decreased activity tolerance       PT Treatment Interventions DME instruction;Therapeutic activities;Gait training;Therapeutic exercise;Patient/family education;Functional mobility training    PT Goals (Current goals can be found in the Care Plan section)  Acute Rehab PT Goals Patient Stated Goal: go home, no calf pain PT Goal Formulation: With patient/family Time For Goal Achievement: 02/21/21 Potential to Achieve Goals: Good    Frequency 7X/week   Barriers to discharge        Co-evaluation               AM-PAC PT "6 Clicks" Mobility  Outcome Measure Help needed turning from your back to your side while in a flat bed without using bedrails?: A Little Help needed moving from lying on your back to sitting on the side of a flat bed without using bedrails?: A Little Help needed moving to and from a bed to a chair (including a wheelchair)?: A Little Help needed standing up from a chair using your arms (e.g., wheelchair or bedside chair)?: A Little Help needed to walk in hospital room?: A Little Help needed climbing 3-5 steps with a railing? : A Little 6 Click Score: 18    End of Session Equipment Utilized During Treatment: Gait belt Activity Tolerance: Patient tolerated treatment well Patient left: in chair;with call bell/phone within reach;with family/visitor present Nurse Communication: Mobility status;Patient requests pain meds PT Visit Diagnosis: Muscle weakness (generalized) (M62.81)    Time: 8638-1771 PT Time Calculation (min) (ACUTE ONLY): 28 min   Charges:   PT Evaluation $PT Eval Low Complexity: 1 Low PT Treatments $Gait Training: 8-22 mins        Blanchard Kelch PT Acute Rehabilitation Services Pager [phone] Office [phone]   Rada Hay 02/14/2021, 3:29 PM

## 2021-02-14 NOTE — Anesthesia Procedure Notes (Signed)
Anesthesia Regional Block: Adductor canal block   Pre-Anesthetic Checklist: , timeout performed,  Correct Patient, Correct Site, Correct Laterality,  Correct Procedure, Correct Position, site marked,  Risks and benefits discussed,  Surgical consent,  Pre-op evaluation,  At surgeon's request and post-op pain management  Laterality: Right  Prep: chloraprep       Needles:  Injection technique: Single-shot  Needle Type: Echogenic Needle     Needle Length: 10cm  Needle Gauge: 21     Additional Needles:   Narrative:  Start time: 02/14/2021 6:55 AM End time: 02/14/2021 6:58 AM Injection made incrementally with aspirations every 5 mL.  Performed by: Personally  Anesthesiologist: Beryle Lathe, MD  Additional Notes: No pain on injection. No increased resistance to injection. Injection made in 5cc increments. Good needle visualization. Patient tolerated the procedure well.

## 2021-02-14 NOTE — Discharge Instructions (Signed)

## 2021-02-14 NOTE — Interval H&P Note (Signed)
History and Physical Interval Note:  02/14/2021 7:27 AM  [name]  has presented today for surgery, with the diagnosis of Right knee medial compartmental osteoarthritis.  The various methods of treatment have been discussed with the patient and family. After consideration of risks, benefits and other options for treatment, the patient has consented to  Procedure(s): UNICOMPARTMENTAL KNEE MEDIALLY (Right) as a surgical intervention.  The patient's history has been reviewed, patient examined, no change in status, stable for surgery.  I have reviewed the patient's chart and labs.  Questions were answered to the patient's satisfaction.     Helen Keller

## 2021-02-15 DIAGNOSIS — M1711 Unilateral primary osteoarthritis, right knee: Secondary | ICD-10-CM | POA: Diagnosis not present

## 2021-02-15 LAB — BASIC METABOLIC PANEL
Anion gap: 7 (ref 5–15)
BUN: 13 mg/dL (ref 6–20)
CO2: 25 mmol/L (ref 22–32)
Calcium: 8.9 mg/dL (ref 8.9–10.3)
Chloride: 105 mmol/L (ref 98–111)
Creatinine, Ser: 0.78 mg/dL (ref 0.44–1.00)
GFR, Estimated: >60 mL/min (ref >60)
Glucose, Bld: 205 mg/dL — ABNORMAL HIGH (ref 70–99)
Potassium: 4 mmol/L (ref 3.5–5.1)
Sodium: 137 mmol/L (ref 135–145)

## 2021-02-15 LAB — GLUCOSE, CAPILLARY
Glucose-Capillary: 200 mg/dL — ABNORMAL HIGH (ref 70–99)
Glucose-Capillary: 204 mg/dL — ABNORMAL HIGH (ref 70–99)

## 2021-02-15 LAB — CBC
HCT: 34.7 % — ABNORMAL LOW (ref 36.0–46.0)
Hemoglobin: 11.2 g/dL — ABNORMAL LOW (ref 12.0–15.0)
MCH: 28.6 pg (ref 26.0–34.0)
MCHC: 32.3 g/dL (ref 30.0–36.0)
MCV: 88.5 fL (ref 80.0–100.0)
Platelets: 287 10*3/uL (ref 150–400)
RBC: 3.92 MIL/uL (ref 3.87–5.11)
RDW: 12.9 % (ref 11.5–15.5)
WBC: 18.7 10*3/uL — ABNORMAL HIGH (ref 4.0–10.5)
nRBC: 0 % (ref 0.0–0.2)

## 2021-02-15 MED ORDER — OXYCODONE HCL 5 MG PO TABS: 5.0000 mg | ORAL_TABLET | Freq: Four times a day (QID) | ORAL | 0 refills | Status: AC | PRN

## 2021-02-15 MED ORDER — ASPIRIN 81 MG PO CHEW: 81.0000 mg | CHEWABLE_TABLET | Freq: Two times a day (BID) | ORAL | 0 refills | Status: AC

## 2021-02-15 MED ORDER — METHOCARBAMOL 500 MG PO TABS: 500.0000 mg | ORAL_TABLET | Freq: Four times a day (QID) | ORAL | 0 refills | Status: AC | PRN

## 2021-02-15 MED ORDER — POLYETHYLENE GLYCOL 3350 17 G PO PACK: 17.0000 g | PACK | Freq: Every day | ORAL | 0 refills | Status: AC | PRN

## 2021-02-15 MED ORDER — DOCUSATE SODIUM 100 MG PO CAPS: 100.0000 mg | ORAL_CAPSULE | Freq: Two times a day (BID) | ORAL | 0 refills | Status: AC

## 2021-02-15 NOTE — Progress Notes (Signed)
Physical Therapy Treatment Patient Details Name: Helen Keller MRN: [id number] DOB: [birth year] Today's Date: 02/15/2021   History of Present Illness 60 yo  female, S/P right uni knee 02/14/21    PT Comments    POD # 1 pm session with Daughter present Assisted OOB to amb to bathroom then in hallway.  Practiced stairs with Daughter "hands on" assisted.  Then returned to room to perform some TE's following HEP handout.  Instructed on proper tech, freq as well as use of ICE.  Addressed all mobility questions, discussed appropriate activity, educated on use of ICE.  Pt ready for D/C to home.   Recommendations for follow up therapy are one component of a multi-disciplinary discharge planning process, led by the attending physician.  Recommendations may be updated based on patient status, additional functional criteria and insurance authorization.  Follow Up Recommendations  Follow physician's recommendations for discharge plan and follow up therapies     Assistance Recommended at Discharge Set up Supervision/Assistance  Equipment Recommendations  Rolling walker (2 wheels)    Recommendations for Other Services       Precautions / Restrictions Precautions Precautions: Fall;Knee Precaution Comments: instructed no pillow under knee Restrictions Weight Bearing Restrictions: No     Mobility  Bed Mobility Overal bed mobility: Needs Assistance Bed Mobility: Supine to Sit           General bed mobility comments: demonstarted and instructed how to use a belt to self asssit    Transfers Overall transfer level: Needs assistance   Transfers: Sit to/from Stand Sit to Stand: Supervision           General transfer comment: 25% VC's to extend R LE and safety with turns.  Also assisted with a toilet transfer.    Ambulation/Gait Ambulation/Gait assistance: Supervision Gait Distance (Feet): 32 Feet Assistive device: Rolling walker (2 wheels) Gait Pattern/deviations: Step-to  pattern;Step-through pattern Gait velocity: decreased     General Gait Details: with increased time amb to bathroom then in hallway to stairs thenh back to bed a total of 32 feet.   Stairs Stairs: Yes Stairs assistance: Min assist Stair Management: No rails;Step to pattern;Forwards Number of Stairs: 2 General stair comments: 50% VC's on proper walker placement and sequencing up 2 steps forward with walker with assist to secure walker.   Wheelchair Mobility    Modified Rankin (Stroke Patients Only)       Balance                                            Cognition Arousal/Alertness: Awake/alert Behavior During Therapy: WFL for tasks assessed/performed Overall Cognitive Status: Within Functional Limits for tasks assessed                                 General Comments: AxO x 3 very pleasant        Exercises      General Comments        Pertinent Vitals/Pain Pain Assessment: 0-10 Pain Score: 10-Worst pain ever Pain Location: R knee with activity Pain Descriptors / Indicators: Discomfort;Operative site guarding;Tender;Tightness Pain Intervention(s): Monitored during session;Premedicated before session;Repositioned;Ice applied    Home Living                          Prior  Function            PT Goals (current goals can now be found in the care plan section) Progress towards PT goals: Progressing toward goals    Frequency    7X/week      PT Plan Current plan remains appropriate    Co-evaluation              AM-PAC PT "6 Clicks" Mobility   Outcome Measure  Help needed turning from your back to your side while in a flat bed without using bedrails?: A Little Help needed moving from lying on your back to sitting on the side of a flat bed without using bedrails?: A Little Help needed moving to and from a bed to a chair (including a wheelchair)?: A Little Help needed standing up from a chair using your  arms (e.g., wheelchair or bedside chair)?: A Little Help needed to walk in hospital room?: A Little Help needed climbing 3-5 steps with a railing? : A Lot 6 Click Score: 17    End of Session Equipment Utilized During Treatment: Gait belt Activity Tolerance: Patient tolerated treatment well Patient left: in chair Nurse Communication: Mobility status PT Visit Diagnosis: Muscle weakness (generalized) (M62.81)     Time: 7517-0017 PT Time Calculation (min) (ACUTE ONLY): 28 min  Charges:  $Gait Training: 8-22 mins $Therapeutic Activity: 8-22 mins                     {Ilan Kahrs  PTA Acute  Rehabilitation Services Pager      [phone] Office      [phone]

## 2021-02-15 NOTE — Progress Notes (Signed)
Physical Therapy Treatment Patient Details Name: Helen Keller MRN: [id number] DOB: [birth year] Today's Date: 02/15/2021   History of Present Illness 60 yo  female, S/P right uni knee 02/14/21    PT Comments    POD #1 am session Assisted OOB demonstrated and instructed how to use a belt to self assist LE.  Assisted with amb to bathroom then hallway.  Practiced stairs Then returned to room to perform some TE's following HEP handout.  Instructed on proper tech, freq as well as use of ICE.   Pt will need another PT session with daughter to address safety with stairs.      Recommendations for follow up therapy are one component of a multi-disciplinary discharge planning process, led by the attending physician.  Recommendations may be updated based on patient status, additional functional criteria and insurance authorization.  Follow Up Recommendations  Follow physician's recommendations for discharge plan and follow up therapies     Assistance Recommended at Discharge Set up Supervision/Assistance  Equipment Recommendations  Rolling walker (2 wheels)    Recommendations for Other Services       Precautions / Restrictions Precautions Precautions: Fall;Knee Precaution Comments: instructed no pillow under knee Restrictions Weight Bearing Restrictions: No     Mobility  Bed Mobility Overal bed mobility: Needs Assistance Bed Mobility: Supine to Sit           General bed mobility comments: demonstarted and instructed how to use a belt to self asssit    Transfers Overall transfer level: Needs assistance   Transfers: Sit to/from Stand Sit to Stand: Supervision           General transfer comment: 25% VC's to extend R LE and safety with turns.  Also assisted with a toilet transfer.    Ambulation/Gait Ambulation/Gait assistance: Supervision Gait Distance (Feet): 32 Feet Assistive device: Rolling walker (2 wheels) Gait Pattern/deviations: Step-to pattern;Step-through  pattern Gait velocity: decreased     General Gait Details: with increased time amb to bathroom then in hallway to stairs thenh back to bed a total of 32 feet.   Stairs Stairs: Yes Stairs assistance: Min assist Stair Management: No rails;Step to pattern;Forwards Number of Stairs: 2 General stair comments: 50% VC's on proper walker placement and sequencing up 2 steps forward with walker with assist to secure walker.   Wheelchair Mobility    Modified Rankin (Stroke Patients Only)       Balance                                            Cognition Arousal/Alertness: Awake/alert Behavior During Therapy: WFL for tasks assessed/performed Overall Cognitive Status: Within Functional Limits for tasks assessed                                 General Comments: AxO x 3 very pleasant        Exercises  Uni Knee Replacement TE's following HEP handout 10 reps B LE ankle pumps 05 reps towel squeezes 05 reps knee presses 05 reps heel slides  05 reps SAQ's 05 reps SLR's 05 reps ABD Educated on use of gait belt to assist with TE's Followed by ICE     General Comments        Pertinent Vitals/Pain Pain Assessment: 0-10 Pain Score: 10-Worst pain ever Pain Location: R  knee with activity Pain Descriptors / Indicators: Discomfort;Operative site guarding;Tender;Tightness Pain Intervention(s): Monitored during session;Premedicated before session;Repositioned;Ice applied    Home Living                          Prior Function            PT Goals (current goals can now be found in the care plan section) Progress towards PT goals: Progressing toward goals    Frequency    7X/week      PT Plan Current plan remains appropriate    Co-evaluation              AM-PAC PT "6 Clicks" Mobility   Outcome Measure  Help needed turning from your back to your side while in a flat bed without using bedrails?: A Little Help needed  moving from lying on your back to sitting on the side of a flat bed without using bedrails?: A Little Help needed moving to and from a bed to a chair (including a wheelchair)?: A Little Help needed standing up from a chair using your arms (e.g., wheelchair or bedside chair)?: A Little Help needed to walk in hospital room?: A Little Help needed climbing 3-5 steps with a railing? : A Lot 6 Click Score: 17    End of Session Equipment Utilized During Treatment: Gait belt Activity Tolerance: Patient tolerated treatment well Patient left: in bed;with call bell/phone within reach Nurse Communication: Mobility status PT Visit Diagnosis: Muscle weakness (generalized) (M62.81)     Time: 9381-8299 PT Time Calculation (min) (ACUTE ONLY): 36 min  Charges:  $Gait Training: 8-22 mins $Therapeutic Exercise: 8-22 mins                     {Shantika Bermea  PTA Acute  Rehabilitation Services Pager      [phone] Office      [phone]

## 2021-02-15 NOTE — Progress Notes (Signed)
Discharge package printed and instructions given to patient. Verbalizes understanding.  

## 2021-02-15 NOTE — Progress Notes (Signed)
° °  Subjective: 1 Day Post-Op Procedure(s) (LRB): UNICOMPARTMENTAL KNEE MEDIALLY (Right) Patient reports pain as mild.   Patient seen in rounds with Dr. Charlann Boxer. Patient is well, and has had no acute complaints or problems. No acute events overnight. Foley catheter removed. Patient ambulated 100 feet with PT. She notes this pain medication did not cause her to itch.  We will continue therapy today.   Objective: Vital signs in last 24 hours: Temp:  [97.4 F (36.3 C)-98.8 F (37.1 C)] 98.4 F (36.9 C) (12/28 0556) Pulse Rate:  [60-87] 72 (12/28 0556) Resp:  [9-23] 16 (12/28 0556) BP: (111-133)/(63-85) 123/66 (12/28 0556) SpO2:  [93 %-100 %] 97 % (12/28 0556)  Intake/Output from previous day:  Intake/Output Summary (Last 24 hours) at 02/15/2021 0726 Last data filed at 02/15/2021 0606 Gross per 24 hour  Intake 3515 ml  Output 2420 ml  Net 1095 ml     Intake/Output this shift: No intake/output data recorded.  Labs: Recent Labs    02/15/21 0313  HGB 11.2*   Recent Labs    02/15/21 0313  WBC 18.7*  RBC 3.92  HCT 34.7*  PLT 287   Recent Labs    02/15/21 0313  NA 137  K 4.0  CL 105  CO2 25  BUN 13  CREATININE 0.78  GLUCOSE 205*  CALCIUM 8.9   No results for input(s): LABPT, INR in the last 72 hours.  Exam: General - Patient is Alert and Oriented Extremity - Neurologically intact Sensation intact distally Intact pulses distally Dorsiflexion/Plantar flexion intact Dressing - dressing C/D/I Motor Function - intact, moving foot and toes well on exam.   Past Medical History:  Diagnosis Date   Anxiety state, unspecified    Asthma    Colitis    Depression    Diabetes mellitus without complication (HCC)    GERD (gastroesophageal reflux disease)    Palpitations    Pneumonia    Portal vein thrombosis 2009   Intrahepatic   Shortness of breath     Assessment/Plan: 1 Day Post-Op Procedure(s) (LRB): UNICOMPARTMENTAL KNEE MEDIALLY (Right) Principal  Problem:   S/P right unicompartmental knee replacement  Estimated body mass index is 33.3 kg/m as calculated from the following:   Height as of this encounter: 5\' 3"  (1.6 m).   Weight as of this encounter: 85.3 kg. Advance diet Up with therapy D/C IV fluids   DVT Prophylaxis - Aspirin Weight bearing as tolerated.  Plan is to go Home after hospital stay. Plan for discharge today following 1-2 sessions of PT as long as they are meeting their goals. Patient is scheduled for OPPT. Follow up in the office in 2 weeks.   , PA-C Orthopedic Surgery [phone] 02/15/2021, 7:26 AM

## 2021-02-15 NOTE — Plan of Care (Signed)
  Problem: Activity: Goal: Ability to avoid complications of mobility impairment will improve Outcome: Progressing Goal: Range of joint motion will improve Outcome: Progressing   Problem: Pain Management: Goal: Pain level will decrease with appropriate interventions Outcome: Progressing   

## 2021-02-15 NOTE — TOC Transition Note (Signed)
Transition of Care [facility]) - CM/SW Discharge Note  Patient Details  Name: Helen Keller MRN: [id number] Date of Birth: 03-16-1960  [facility] of [facility]) CM/SW Contact:  Sherie Don, LCSW Phone Number: 02/15/2021, 11:57 AM  Clinical Narrative: Patient is expected to discharge home after working with PT. CSW met with patient to confirm discharge plan and needs. Patient will discharge home with OPPT at [facility]. Patient will need a rolling walker. [employer] delivered walker to patient's room. TOC signing off.  Final next level of care: OP Rehab Barriers to Discharge: No Barriers Identified  Patient Goals and CMS Choice Patient states their goals for this hospitalization and ongoing recovery are:: Discharge home with OPPT at [facility] CMS Medicare.gov Compare Post Acute Care list provided to:: Patient Choice offered to / list presented to : Patient  Discharge Plan and Services        DME Arranged: Walker rolling DME Agency: [employer] Representative spoke with at DME Agency: Prearranged in orthopedist's office  Readmission Risk Interventions No flowsheet data found.

## 2021-02-16 ENCOUNTER — Encounter (HOSPITAL_COMMUNITY): Payer: Self-pay | Admitting: Orthopedic Surgery

## 2021-02-23 NOTE — Discharge Summary (Signed)
Patient ID: Helen Keller MRN: [id number] DOB/AGE: [birth year] 61 y.o.  Admit date: 02/14/2021 Discharge date: 02/15/2021  Admission Diagnoses:  Principal Problem:   S/P right unicompartmental knee replacement   Discharge Diagnoses:  Same  Past Medical History:  Diagnosis Date   Anxiety state, unspecified    Asthma    Colitis    Depression    Diabetes mellitus without complication (HCC)    GERD (gastroesophageal reflux disease)    Palpitations    Pneumonia    Portal vein thrombosis 2009   Intrahepatic   Shortness of breath     Surgeries: Procedure(s): UNICOMPARTMENTAL KNEE MEDIALLY on 02/14/2021   Consultants:   Discharged Condition: Improved  Hospital Course: Helen Keller is an 61 y.o. female who was admitted 02/14/2021 for operative treatment ofS/P right unicompartmental knee replacement. Patient has severe unremitting pain that affects sleep, daily activities, and work/hobbies. After pre-op clearance the patient was taken to the operating room on 02/14/2021 and underwent  Procedure(s): UNICOMPARTMENTAL KNEE MEDIALLY.    Patient was given perioperative antibiotics:  Anti-infectives (From admission, onward)    Start     Dose/Rate Route Frequency Ordered Stop   02/14/21 1400  ceFAZolin (ANCEF) IVPB 2g/100 mL premix        2 g 200 mL/hr over 30 Minutes Intravenous Every 6 hours 02/14/21 0953 02/14/21 2103   02/14/21 0600  ceFAZolin (ANCEF) IVPB 2g/100 mL premix        2 g 200 mL/hr over 30 Minutes Intravenous On call to O.R. 02/14/21 [id number] 02/14/21 0815        Patient was given sequential compression devices, early ambulation, and chemoprophylaxis to prevent DVT. Patient worked with PT and was meeting their goals regarding safe ambulation and transfers.  Patient benefited maximally from hospital stay and there were no complications.    Recent vital signs: No data found.   Recent laboratory studies: No results for input(s): WBC, HGB, HCT, PLT, NA, K, CL, CO2,  BUN, CREATININE, GLUCOSE, INR, CALCIUM in the last 72 hours.  Invalid input(s): PT, 2   Discharge Medications:   Allergies as of 02/15/2021       Reactions   Codeine    itching        Medication List     TAKE these medications    ALPRAZolam 0.25 MG tablet Commonly known as: XANAX Take 0.25 mg by mouth 2 (two) times daily as needed for anxiety.   aspirin 81 MG chewable tablet Chew 1 tablet (81 mg total) by mouth 2 (two) times daily for 28 days. For prevention of blood clot   docusate sodium 100 MG capsule Commonly known as: COLACE Take 1 capsule (100 mg total) by mouth 2 (two) times daily.   losartan 25 MG tablet Commonly known as: COZAAR Take 25 mg by mouth daily.   meloxicam 15 MG tablet Commonly known as: MOBIC Take 15 mg by mouth daily.   metFORMIN 500 MG tablet Commonly known as: GLUCOPHAGE Take 250 mg by mouth 2 (two) times daily.   methocarbamol 500 MG tablet Commonly known as: ROBAXIN Take 1 tablet (500 mg total) by mouth every 6 (six) hours as needed for muscle spasms.   multivitamin with minerals Tabs tablet Take 1 tablet by mouth daily.   oxyCODONE 5 MG immediate release tablet Commonly known as: Oxy IR/ROXICODONE Take 1-2 tablets (5-10 mg total) by mouth every 6 (six) hours as needed for severe pain.   polyethylene glycol 17 g packet Commonly known as: MIRALAX /  GLYCOLAX Take 17 g by mouth daily as needed for mild constipation.   simvastatin 20 MG tablet Commonly known as: ZOCOR Take 20 mg by mouth every evening.   valACYclovir 1000 MG tablet Commonly known as: VALTREX Take 1,000 mg by mouth 3 (three) times daily as needed (shingles).               Discharge Care Instructions  (From admission, onward)           Start     Ordered   02/15/21 0000  Change dressing       Comments: Maintain surgical dressing until follow up in the clinic. If the edges start to pull up, may reinforce with tape. If the dressing is no longer  working, may remove and cover with gauze and tape, but must keep the area dry and clean.  Call with any questions or concerns.   02/15/21 0729            Diagnostic Studies: No results found.  Disposition: Discharge disposition: 01-Home or Self Care       Discharge Instructions     Call MD / Call 911   Complete by: As directed    If you experience chest pain or shortness of breath, CALL 911 and be transported to the hospital emergency room.  If you develope a fever above 101 F, pus (white drainage) or increased drainage or redness at the wound, or calf pain, call your surgeon's office.   Change dressing   Complete by: As directed    Maintain surgical dressing until follow up in the clinic. If the edges start to pull up, may reinforce with tape. If the dressing is no longer working, may remove and cover with gauze and tape, but must keep the area dry and clean.  Call with any questions or concerns.   Constipation Prevention   Complete by: As directed    Drink plenty of fluids.  Prune juice may be helpful.  You may use a stool softener, such as Colace (over the counter) 100 mg twice a day.  Use MiraLax (over the counter) for constipation as needed.   Diet - low sodium heart healthy   Complete by: As directed    Increase activity slowly as tolerated   Complete by: As directed    Weight bearing as tolerated with assist device (walker, cane, etc) as directed, use it as long as suggested by your surgeon or therapist, typically at least 4-6 weeks.   Post-operative opioid taper instructions:   Complete by: As directed    POST-OPERATIVE OPIOID TAPER INSTRUCTIONS: It is important to wean off of your opioid medication as soon as possible. If you do not need pain medication after your surgery it is ok to stop day one. Opioids include: Codeine, Hydrocodone(Norco, Vicodin), Oxycodone(Percocet, oxycontin) and hydromorphone amongst others.  Long term and even short term use of opiods can  cause: Increased pain response Dependence Constipation Depression Respiratory depression And more.  Withdrawal symptoms can include Flu like symptoms Nausea, vomiting And more Techniques to manage these symptoms Hydrate well Eat regular healthy meals Stay active Use relaxation techniques(deep breathing, meditating, yoga) Do Not substitute Alcohol to help with tapering If you have been on opioids for less than two weeks and do not have pain than it is ok to stop all together.  Plan to wean off of opioids This plan should start within one week post op of your joint replacement. Maintain the same interval or time between  taking each dose and first decrease the dose.  Cut the total daily intake of opioids by one tablet each day Next start to increase the time between doses. The last dose that should be eliminated is the evening dose.      TED hose   Complete by: As directed    Use stockings (TED hose) for 2 weeks on both leg(s).  You may remove them at night for sleeping.        Follow-up Information     Paralee Cancel, MD. Schedule an appointment as soon as possible for a visit in 2 week(s).   Specialty: Orthopedic Surgery Contact information: [address] [place] [address] [phone]                  Signed: Irving Copas 02/23/2021, 8:26 AM

## 2022-08-28 ENCOUNTER — Emergency Department (HOSPITAL_COMMUNITY): Payer: Commercial Managed Care - PPO

## 2022-08-28 ENCOUNTER — Emergency Department (HOSPITAL_COMMUNITY)
Admission: EM | Admit: 2022-08-28 | Discharge: 2022-08-28 | Disposition: A | Discharge status: Home / Self Care | Payer: Commercial Managed Care - PPO | Attending: Emergency Medicine | Admitting: Emergency Medicine

## 2022-08-28 ENCOUNTER — Encounter (HOSPITAL_COMMUNITY): Payer: Self-pay | Admitting: Emergency Medicine

## 2022-08-28 ENCOUNTER — Other Ambulatory Visit: Payer: Self-pay

## 2022-08-28 DIAGNOSIS — Z7984 Long term (current) use of oral hypoglycemic drugs: Secondary | ICD-10-CM | POA: Insufficient documentation

## 2022-08-28 DIAGNOSIS — J45909 Unspecified asthma, uncomplicated: Secondary | ICD-10-CM | POA: Insufficient documentation

## 2022-08-28 DIAGNOSIS — R109 Unspecified abdominal pain: Secondary | ICD-10-CM

## 2022-08-28 DIAGNOSIS — E119 Type 2 diabetes mellitus without complications: Secondary | ICD-10-CM | POA: Diagnosis not present

## 2022-08-28 DIAGNOSIS — K922 Gastrointestinal hemorrhage, unspecified: Secondary | ICD-10-CM | POA: Insufficient documentation

## 2022-08-28 LAB — CBC
HCT: 39.3 % (ref 36.0–46.0)
Hemoglobin: 12.8 g/dL (ref 12.0–15.0)
MCH: 28.3 pg (ref 26.0–34.0)
MCHC: 32.6 g/dL (ref 30.0–36.0)
MCV: 86.9 fL (ref 80.0–100.0)
Platelets: 357 10*3/uL (ref 150–400)
RBC: 4.52 MIL/uL (ref 3.87–5.11)
RDW: 13.1 % (ref 11.5–15.5)
WBC: 11.3 10*3/uL — ABNORMAL HIGH (ref 4.0–10.5)
nRBC: 0 % (ref 0.0–0.2)

## 2022-08-28 LAB — BASIC METABOLIC PANEL
Anion gap: 15 (ref 5–15)
BUN: 7 mg/dL (ref 6–20)
CO2: 25 mmol/L (ref 22–32)
Calcium: 10 mg/dL (ref 8.9–10.3)
Chloride: 96 mmol/L — ABNORMAL LOW (ref 98–111)
Creatinine, Ser: 0.77 mg/dL (ref 0.44–1.00)
GFR, Estimated: >60 mL/min (ref >60)
Glucose, Bld: 125 mg/dL — ABNORMAL HIGH (ref 70–99)
Potassium: 3.8 mmol/L (ref 3.5–5.1)
Sodium: 136 mmol/L (ref 135–145)

## 2022-08-28 LAB — URINALYSIS, ROUTINE W REFLEX MICROSCOPIC
Bilirubin Urine: NEGATIVE
Glucose, UA: NEGATIVE mg/dL
Hgb urine dipstick: NEGATIVE
Ketones, ur: NEGATIVE mg/dL
Leukocytes,Ua: NEGATIVE
Nitrite: NEGATIVE
Protein, ur: NEGATIVE mg/dL
Specific Gravity, Urine: 1.003 — ABNORMAL LOW (ref 1.005–1.030)
pH: 6 (ref 5.0–8.0)

## 2022-08-28 LAB — POC OCCULT BLOOD, ED: Fecal Occult Bld: NEGATIVE

## 2022-08-28 LAB — TYPE AND SCREEN
ABO/RH(D): A POS
Antibody Screen: NEGATIVE

## 2022-08-28 LAB — LIPASE, BLOOD: Lipase: 26 U/L (ref 11–51)

## 2022-08-28 MED ORDER — IOHEXOL 350 MG/ML SOLN
75.0000 mL | Freq: Once | INTRAVENOUS | Status: AC | PRN
  Administered 2022-08-28: 75 mL via INTRAVENOUS

## 2022-08-28 NOTE — ED Notes (Signed)
Back from CT

## 2022-08-28 NOTE — ED Triage Notes (Signed)
Patient arrives ambulatory by POV c/o syncopal episode this morning about 4 am. Patient states she has GI issues and having abdominal cramping along with bright red blood in her stool since 7 am. Currently on antibiotics for pneumonia.

## 2022-08-28 NOTE — ED Provider Notes (Signed)
[place] EMERGENCY DEPARTMENT AT [facility] Provider Note   CSN: [id number] Arrival date & time: 08/28/22  4098     History  Chief Complaint  Patient presents with   Loss of Consciousness   Abdominal Pain    Helen Keller is a 62 y.o. female.   Loss of Consciousness Abdominal Pain Patient has several different plaints over the last week.  States a week ago she had dysuria saw her PCP and diagnosed with UTI and started on what sounds like Macrobid.  Took some of the pills but not all of them.  States she then developed left-sided chest pain posteriorly.  Had a cough.  States she went to urgent care had an x-ray and diagnosed with pneumonia.  Treated with Augmentin.  Now having some abdominal cramping and had some blood in her stool.  States last time she had it she was deathly will feel like she is now.  States she is feeling lightheaded.  States she has seen Dr. Elnoria Howard in the past from GI.  States she had potentially an ischemic colitis.    Past Medical History:  Diagnosis Date   Anxiety state, unspecified    Asthma    Colitis    Depression    Diabetes mellitus without complication (HCC)    GERD (gastroesophageal reflux disease)    Palpitations    Pneumonia    Portal vein thrombosis 2009   Intrahepatic   Shortness of breath     Home Medications Prior to Admission medications   Medication Sig Start Date End Date Taking? Authorizing Provider  ALPRAZolam (XANAX) 0.25 MG tablet Take 0.25 mg by mouth 2 (two) times daily as needed for anxiety.    [provider]  docusate sodium (COLACE) 100 MG capsule Take 1 capsule (100 mg total) by mouth 2 (two) times daily. 02/15/21   Cassandria Anger, PA-C  losartan (COZAAR) 25 MG tablet Take 25 mg by mouth daily.    [provider]  meloxicam (MOBIC) 15 MG tablet Take 15 mg by mouth daily.    [provider]  metFORMIN (GLUCOPHAGE) 500 MG tablet Take 250 mg by mouth 2 (two) times daily. 01/03/21    [provider]  methocarbamol (ROBAXIN) 500 MG tablet Take 1 tablet (500 mg total) by mouth every 6 (six) hours as needed for muscle spasms. 02/15/21   Cassandria Anger, PA-C  Multiple Vitamin (MULTIVITAMIN WITH MINERALS) TABS tablet Take 1 tablet by mouth daily.    [provider]  oxyCODONE (OXY IR/ROXICODONE) 5 MG immediate release tablet Take 1-2 tablets (5-10 mg total) by mouth every 6 (six) hours as needed for severe pain. 02/15/21   Cassandria Anger, PA-C  polyethylene glycol (MIRALAX / GLYCOLAX) 17 g packet Take 17 g by mouth daily as needed for mild constipation. 02/15/21   Cassandria Anger, PA-C  simvastatin (ZOCOR) 20 MG tablet Take 20 mg by mouth every evening.    [provider]  valACYclovir (VALTREX) 1000 MG tablet Take 1,000 mg by mouth 3 (three) times daily as needed (shingles).    [provider]      Allergies    Codeine    Review of Systems   Review of Systems  Cardiovascular:  Positive for syncope.  Gastrointestinal:  Positive for abdominal pain.    Physical Exam Updated Vital Signs BP 119/67   Pulse 65   Temp 98.9 F (37.2 C) (Oral)   Resp 15   Ht 5\' 3"  (  1.6 m)   Wt 78.5 kg   SpO2 96%   BMI 30.65 kg/m  Physical Exam Vitals and nursing note reviewed.  Eyes:     Pupils: Pupils are equal, round, and reactive to light.  Cardiovascular:     Rate and Rhythm: Regular rhythm.  Pulmonary:     Breath sounds: Normal breath sounds.  Abdominal:     Tenderness: There is no abdominal tenderness.     Hernia: No hernia is present.  Genitourinary:    Rectum: Normal.  Skin:    General: Skin is warm.     Capillary Refill: Capillary refill takes less than 2 seconds.  Neurological:     Mental Status: She is alert.     ED Results / Procedures / Treatments   Labs (all labs ordered are listed, but only abnormal results are displayed) Labs Reviewed  BASIC METABOLIC PANEL - Abnormal; Notable for the following components:       Result Value   Chloride 96 (*)    Glucose, Bld 125 (*)    All other components within normal limits  CBC - Abnormal; Notable for the following components:   WBC 11.3 (*)    All other components within normal limits  URINALYSIS, ROUTINE W REFLEX MICROSCOPIC - Abnormal; Notable for the following components:   Color, Urine STRAW (*)    Specific Gravity, Urine 1.003 (*)    All other components within normal limits  LIPASE, BLOOD  POC OCCULT BLOOD, ED  TYPE AND SCREEN    EKG EKG Interpretation Date/Time:  Tuesday August 28 2022 09:31:36 EDT Ventricular Rate:  80 PR Interval:  144 QRS Duration:  88 QT Interval:  362 QTC Calculation: 417 R Axis:   -32  Text Interpretation: Normal sinus rhythm Left axis deviation Cannot rule out Anterior infarct , age undetermined Abnormal ECG When compared with ECG of 01-Feb-2021 12:15, No significant change since last tracing Confirmed by Benjiman Core [phone]) on 08/28/2022 10:46:17 AM  Radiology CT ABDOMEN PELVIS W CONTRAST  Result Date: 08/28/2022 CLINICAL DATA:  Acute abdominal pain and hematochezia beginning this morning. EXAM: CT ABDOMEN AND PELVIS WITH CONTRAST TECHNIQUE: Multidetector CT imaging of the abdomen and pelvis was performed using the standard protocol following bolus administration of intravenous contrast. RADIATION DOSE REDUCTION: This exam was performed according to the departmental dose-optimization program which includes automated exposure control, adjustment of the mA and/or kV according to patient size and/or use of iterative reconstruction technique. CONTRAST:  75mL OMNIPAQUE IOHEXOL 350 MG/ML SOLN COMPARISON:  06/17/2013 FINDINGS: Lower Chest: No acute findings. Hepatobiliary: No suspicious hepatic masses identified. Gallbladder is unremarkable. No evidence of biliary ductal dilatation. Pancreas:  No mass or inflammatory changes. Spleen: Within normal limits in size and appearance. Adrenals/Urinary Tract: No suspicious masses  identified. No evidence of ureteral calculi or hydronephrosis. Unremarkable unopacified urinary bladder. Stomach/Bowel: No evidence of obstruction, inflammatory process or abnormal fluid collections. Normal appendix visualized. Vascular/Lymphatic: No pathologically enlarged lymph nodes. No acute vascular findings. Reproductive: Prior hysterectomy noted. Adnexal regions are unremarkable in appearance. Other:  None. Musculoskeletal:  No suspicious bone lesions identified. IMPRESSION: No acute findings or other significant abnormality. Electronically Signed   By: Danae Orleans M.D.   On: 08/28/2022 14:26   DG Chest 2 View  Result Date: 08/28/2022 CLINICAL DATA:  chest pain EXAM: CHEST - 2 VIEW COMPARISON:  CXR 12/17/19. FINDINGS: No pleural effusion. No pneumothorax. No focal airspace opacity. Normal cardiac and mediastinal contours. No radiographically displaced rib fractures. Visualized upper abdomen  is unremarkable. Vertebral body heights are maintained. IMPRESSION: No focal airspace opacity Electronically Signed   By: Lorenza Cambridge M.D.   On: 08/28/2022 12:10    Procedures Procedures    Medications Ordered in ED Medications  iohexol (OMNIPAQUE) 350 MG/ML injection 75 mL (75 mLs Intravenous Contrast Given 08/28/22 1356)    ED Course/ Medical Decision Making/ A&P                             Medical Decision Making Amount and/or Complexity of Data Reviewed Labs: ordered. Radiology: ordered.  Risk Prescription drug management.   Patient with various complaints.  Had dysuria had cough on antibiotics for urine and pneumonia although stopped the urine antibiotics early.  Now with GI symptoms.  Differential diagnoses long but includes urinary tract infected pneumonia colitis, medication effect.  Had near syncopal episode.  Felt like lightheaded when she had to go to the bathroom.  Will check Hemoccult.  Reviewed previous CT scans.  Hemoccult was negative.  Blood work reassuring.  CT scan  reassuring.  Vitals reassuring.  Likely vagal syncope.  Can follow-up with Dr. Elnoria Howard who she has seen previously for the GI bleed which appears to have decreased or resolved.  No signs of colitis on CT.  Also on antibiotics for pneumonia.  X-ray showed no pneumonia.  Will have her stop her Augmentin.        Final Clinical Impression(s) / ED Diagnoses Final diagnoses:  Abdominal pain, unspecified abdominal location  Gastrointestinal hemorrhage, unspecified gastrointestinal hemorrhage type    Rx / DC Orders ED Discharge Orders     None         Benjiman Core, MD 08/28/22 1501

## 2023-05-13 ENCOUNTER — Other Ambulatory Visit: Payer: Self-pay | Admitting: Physician Assistant

## 2023-05-13 DIAGNOSIS — R1013 Epigastric pain: Secondary | ICD-10-CM

## 2023-05-21 ENCOUNTER — Ambulatory Visit
Admission: RE | Admit: 2023-05-21 | Discharge: 2023-05-21 | Disposition: A | Discharge status: Home / Self Care | Source: Ambulatory Visit | Attending: Physician Assistant | Admitting: Physician Assistant

## 2023-05-21 DIAGNOSIS — R1013 Epigastric pain: Secondary | ICD-10-CM
